# Patient Record
Sex: Male | Born: 1965 | Race: White | Hispanic: No | Marital: Married | Smoking: Never smoker
Health system: Southern US, Community
[De-identification: ages and names within clinical notes are randomized; demographics above are authoritative.]

## PROBLEM LIST (undated history)

## (undated) DIAGNOSIS — J383 Other diseases of vocal cords: Secondary | ICD-10-CM

## (undated) DIAGNOSIS — M109 Gout, unspecified: Secondary | ICD-10-CM

## (undated) DIAGNOSIS — K219 Gastro-esophageal reflux disease without esophagitis: Secondary | ICD-10-CM

## (undated) DIAGNOSIS — R112 Nausea with vomiting, unspecified: Secondary | ICD-10-CM

## (undated) DIAGNOSIS — N200 Calculus of kidney: Secondary | ICD-10-CM

## (undated) DIAGNOSIS — J189 Pneumonia, unspecified organism: Secondary | ICD-10-CM

## (undated) DIAGNOSIS — Z9889 Other specified postprocedural states: Secondary | ICD-10-CM

## (undated) DIAGNOSIS — E669 Obesity, unspecified: Secondary | ICD-10-CM

## (undated) DIAGNOSIS — G4730 Sleep apnea, unspecified: Secondary | ICD-10-CM

## (undated) DIAGNOSIS — Z87442 Personal history of urinary calculi: Secondary | ICD-10-CM

## (undated) DIAGNOSIS — M1990 Unspecified osteoarthritis, unspecified site: Secondary | ICD-10-CM

## (undated) HISTORY — PX: HERNIA REPAIR: SHX51

## (undated) HISTORY — PX: COLONOSCOPY: SHX174

## (undated) HISTORY — PX: COLONOSCOPY WITH ESOPHAGOGASTRODUODENOSCOPY (EGD): SHX5779

## (undated) HISTORY — PX: OTHER SURGICAL HISTORY: SHX169

---

## 2006-11-11 ENCOUNTER — Ambulatory Visit: Payer: Self-pay | Admitting: Family Medicine

## 2007-11-19 ENCOUNTER — Ambulatory Visit: Payer: Self-pay | Admitting: Gastroenterology

## 2012-12-17 ENCOUNTER — Ambulatory Visit: Payer: Self-pay | Admitting: Gastroenterology

## 2013-08-04 ENCOUNTER — Emergency Department: Payer: Self-pay

## 2015-03-03 ENCOUNTER — Ambulatory Visit
Admission: EM | Admit: 2015-03-03 | Discharge: 2015-03-03 | Disposition: A | Discharge status: Home / Self Care | Payer: 59 | Attending: Family Medicine | Admitting: Family Medicine

## 2015-03-03 DIAGNOSIS — J101 Influenza due to other identified influenza virus with other respiratory manifestations: Secondary | ICD-10-CM

## 2015-03-03 LAB — RAPID STREP SCREEN (MED CTR MEBANE ONLY): Streptococcus, Group A Screen (Direct): NEGATIVE

## 2015-03-03 LAB — RAPID INFLUENZA A&B ANTIGENS
Influenza A (ARMC): DETECTED
Influenza B (ARMC): NOT DETECTED

## 2015-03-03 MED ORDER — OSELTAMIVIR PHOSPHATE 75 MG PO CAPS: 75.0000 mg | ORAL_CAPSULE | Freq: Two times a day (BID) | ORAL | Status: DC

## 2015-03-03 NOTE — ED Notes (Signed)
Patient c/o fever, body aches, cough, sore throat, and chest congestion which started yesterday.  Pt.'s wife states that there are people in their household with the flu.

## 2015-03-03 NOTE — ED Provider Notes (Signed)
CSN: [id number]     Arrival date & time 03/03/15  1335 History   First MD Initiated Contact with Patient 03/03/15 1506     Chief Complaint  Patient presents with  . Cough  . Sore Throat   (Consider location/radiation/quality/duration/timing/severity/associated sxs/prior Treatment) Patient is a 50 y.o. male presenting with URI. The history is provided by the patient.  URI Presenting symptoms: congestion, cough, fatigue, fever and sore throat   Severity:  Moderate Onset quality:  Sudden Duration:  2 days Timing:  Constant Progression:  Worsening Chronicity:  New Relieved by:  Nothing Worsened by:  Nothing tried Ineffective treatments:  None tried Associated symptoms: headaches and myalgias   Associated symptoms: no arthralgias, no neck pain, no sinus pain, no sneezing and no wheezing   Risk factors: sick contacts   Risk factors: not elderly, no chronic cardiac disease, no chronic kidney disease, no chronic respiratory disease, no diabetes mellitus, no immunosuppression, no recent illness and no recent travel     History reviewed. No pertinent past medical history. Past Surgical History  Procedure Laterality Date  . Hernia repair    . Lithrotripsy for kidney stone removal     History reviewed. No pertinent family history. Social History  Substance Use Topics  . Smoking status: Never Smoker   . Smokeless tobacco: Current User    Types: Chew  . Alcohol Use: No    Review of Systems  Constitutional: Positive for fever and fatigue.  HENT: Positive for congestion and sore throat. Negative for sneezing.   Respiratory: Positive for cough. Negative for wheezing.   Musculoskeletal: Positive for myalgias. Negative for arthralgias and neck pain.  Neurological: Positive for headaches.    Allergies  Oxycodone  Home Medications   Prior to Admission medications   Medication Sig Start Date End Date Taking? Authorizing Provider  GARLIC PO Take by mouth.   Yes Historical Provider,  MD  oseltamivir (TAMIFLU) 75 MG capsule Take 1 capsule (75 mg total) by mouth 2 (two) times daily. 03/03/15   Payton Mccallum, MD  oseltamivir (TAMIFLU) 75 MG capsule Take 1 capsule (75 mg total) by mouth 2 (two) times daily. 03/03/15   Payton Mccallum, MD   Meds Ordered and Administered this Visit  Medications - No data to display  BP 118/79 mmHg  Pulse 70  Temp(Src) 99.2 F (37.3 C) (Oral)  Resp 16  Ht  (1.727 m)  Wt 210 lb (95.255 kg)  BMI 31.94 kg/m2  SpO2 99% No data found.   Physical Exam  Constitutional: He appears well-developed and well-nourished. No distress.  HENT:  Head: Normocephalic and atraumatic.  Right Ear: Tympanic membrane, external ear and ear canal normal.  Left Ear: Tympanic membrane, external ear and ear canal normal.  Nose: Rhinorrhea present.  Mouth/Throat: Uvula is midline, oropharynx is clear and moist and mucous membranes are normal. No oropharyngeal exudate or tonsillar abscesses.  Eyes: Conjunctivae and EOM are normal. Pupils are equal, round, and reactive to light. Right eye exhibits no discharge. Left eye exhibits no discharge. No scleral icterus.  Neck: Normal range of motion. Neck supple. No tracheal deviation present. No thyromegaly present.  Cardiovascular: Normal rate, regular rhythm and normal heart sounds.   Pulmonary/Chest: Effort normal and breath sounds normal. No stridor. No respiratory distress. He has no wheezes. He has no rales. He exhibits no tenderness.  Lymphadenopathy:    He has no cervical adenopathy.  Neurological: He is alert.  Skin: Skin is warm and dry. No rash noted.  He is not diaphoretic.  Nursing note and vitals reviewed.   ED Course  Procedures (including critical care time)  Labs Review Labs Reviewed  RAPID INFLUENZA A&B ANTIGENS ([facility] ONLY)  RAPID STREP SCREEN (NOT AT [facility])  CULTURE, GROUP A STREP [facility])    Imaging Review No results found.   Visual Acuity Review  Right Eye Distance:   Left Eye  Distance:   Bilateral Distance:    Right Eye Near:   Left Eye Near:    Bilateral Near:         MDM   1. Influenza A    New Prescriptions   OSELTAMIVIR (TAMIFLU) 75 MG CAPSULE    Take 1 capsule (75 mg total) by mouth 2 (two) times daily.   OSELTAMIVIR (TAMIFLU) 75 MG CAPSULE    Take 1 capsule (75 mg total) by mouth 2 (two) times daily.   1. Lab results and diagnosis reviewed with patient 2. rx as per orders above; reviewed possible side effects, interactions, risks and benefits  3. Recommend supportive treatment with otc analgesics, increased fluids, rest 4. Follow-up prn if symptoms worsen or don't improve    Payton Mccallum, MD 03/03/15 1526

## 2015-03-05 LAB — CULTURE, GROUP A STREP (THRC)

## 2016-05-31 ENCOUNTER — Ambulatory Visit (INDEPENDENT_AMBULATORY_CARE_PROVIDER_SITE_OTHER): Payer: 59

## 2016-05-31 ENCOUNTER — Ambulatory Visit: Payer: 59

## 2016-05-31 ENCOUNTER — Ambulatory Visit
Admission: EM | Admit: 2016-05-31 | Discharge: 2016-05-31 | Disposition: A | Discharge status: Home / Self Care | Payer: 59 | Attending: Family Medicine | Admitting: Family Medicine

## 2016-05-31 DIAGNOSIS — S61247A Puncture wound with foreign body of left little finger without damage to nail, initial encounter: Secondary | ICD-10-CM | POA: Diagnosis not present

## 2016-05-31 DIAGNOSIS — S6991XA Unspecified injury of right wrist, hand and finger(s), initial encounter: Secondary | ICD-10-CM

## 2016-05-31 MED ORDER — CEPHALEXIN 500 MG PO CAPS: 500.0000 mg | ORAL_CAPSULE | Freq: Four times a day (QID) | ORAL | 0 refills | Status: DC

## 2016-05-31 MED ORDER — TETANUS-DIPHTH-ACELL PERTUSSIS 5-2.5-18.5 LF-MCG/0.5 IM SUSP
0.5000 mL | Freq: Once | INTRAMUSCULAR | Status: AC
  Administered 2016-05-31: 0.5 mL via INTRAMUSCULAR

## 2016-05-31 MED ORDER — LIDOCAINE HCL (PF) 1 % IJ SOLN: 5.0000 mL | Freq: Once | INTRAMUSCULAR | Status: DC

## 2016-05-31 NOTE — Discharge Instructions (Signed)
Take medication as prescribed. Keep area clean. Apply topical antibiotic ointment.   Follow up with your primary care physician this week as needed. Return to Urgent care for new or worsening concerns.

## 2016-05-31 NOTE — ED Triage Notes (Signed)
Patient has a fish hook stuck in his right hand pinky finger. Patient states that the hook popped out of the fish and into his hand around 12pm. Patient states that he was able to cut the barb off but could not remove this last piece.

## 2016-05-31 NOTE — ED Provider Notes (Signed)
[facility] ____________________________________________  Time seen: Approximately 2:38 PM  I have reviewed the triage vital signs and the nursing notes.   HISTORY  Chief Complaint Foreign Body in Skin   HPI Christopher Castaneda is a 51 y.o. male presenting with spouse at bedside for evaluation of fish hook present and right fifth digit. Patient reports they were fishing just prior to arrival. Patient states that the fish jumped upwards causing the foot to then large in his right fifth digit. Patient states that he cut part of the hook off and tried to remove the hook himself but was unable to. Unsure of last tetanus immunization. States minimal pain at foreign body site. Denies other pain or injury. Denies head injury or loss consciousness. Reports right-hand dominant. Denies paresthesias, decreased range of motion or other injury.  Denies chest pain, shortness of breath, or rash. Denies recent sickness. Denies recent antibiotic use.    History reviewed. No pertinent past medical history.  There are no active problems to display for this patient.   Past Surgical History:  Procedure Laterality Date  . HERNIA REPAIR    . Lithrotripsy for kidney stone removal        Current Facility-Administered Medications:  .  lidocaine (PF) (XYLOCAINE) 1 % injection 5 mL, 5 mL, Other, Once, Renford Dills, NP  Current Outpatient Prescriptions:  .  cephALEXin (KEFLEX) 500 MG capsule, Take 1 capsule (500 mg total) by mouth 4 (four) times daily., Disp: 20 capsule, Rfl: 0 .  GARLIC PO, Take by mouth., Disp: , Rfl:  .  oseltamivir (TAMIFLU) 75 MG capsule, Take 1 capsule (75 mg total) by mouth 2 (two) times daily., Disp: 10 capsule, Rfl: 0 .  oseltamivir (TAMIFLU) 75 MG capsule, Take 1 capsule (75 mg total) by mouth 2 (two) times daily., Disp: 10 capsule, Rfl: 0  Allergies Oxycodone  History reviewed. No pertinent family history.  Social History Social History  Substance Use  Topics  . Smoking status: Never Smoker  . Smokeless tobacco: Current User    Types: Chew  . Alcohol use No    Review of Systems Constitutional: No fever/chills Cardiovascular: Denies chest pain. Respiratory: Denies shortness of breath. Gastrointestinal: No abdominal pain.   Musculoskeletal: Negative for back pain. Skin: As above.   ____________________________________________   PHYSICAL EXAM:  VITAL SIGNS: ED Triage Vitals  Enc Vitals Group     BP 05/31/16 1351 130/87     Pulse Rate 05/31/16 1351 72     Resp 05/31/16 1351 18     Temp 05/31/16 1351 97.1 F (36.2 C)     Temp Source 05/31/16 1351 Oral     SpO2 05/31/16 1351 99 %     Weight 05/31/16 1348 200 lb (90.7 kg)     Height 05/31/16 1348 5\' 7"  (1.702 m)     Head Circumference --      Peak Flow --      Pain Score 05/31/16 1348 6     Pain Loc --      Pain Edu? --      Excl. in GC? --     Constitutional: Alert and oriented. Well appearing and in no acute distress. Cardiovascular: Normal rate, regular rhythm. Grossly normal heart sounds.  Good peripheral circulation. Respiratory: Normal respiratory effort without tachypnea nor retractions. Breath sounds are clear and equal bilaterally. No wheezes, rales, rhonchi. Musculoskeletal: Ambulatory with steady gait. Neurologic:  Normal speech and language. Speech is normal. No gait instability.  Skin:  Skin  is warm, dry. Except: Right fifth digit fishhook foreign body present on the palmar surface of the middle phalanx, minimal tenderness to direct palpation, no active bleeding, no bony tenderness, normal distal sensation and capillary refill. Post hook removal, normal distal sensation and full range of motion present, no motor or tendon deficits noted. Psychiatric: Mood and affect are normal. Speech and behavior are normal. Patient exhibits appropriate insight and judgment   ___________________________________________   LABS (all labs ordered are listed, but only abnormal  results are displayed)  Labs Reviewed - No data to display ____________________________________________  RADIOLOGY  Dg Finger Little Right  Result Date: 05/31/2016 CLINICAL DATA:  Fishhook in fifth digit, initial encounter EXAM: RIGHT LITTLE FINGER 2+V COMPARISON:  None. FINDINGS: No acute fracture or dislocation is noted. A curved metallic foreign body is noted in the the ventral aspect of the fifth digit adjacent to the fifth middle phalanx. No bony trauma is noted. IMPRESSION: Radiopaque foreign body consistent with the given clinical history in the mid fifth digit. No bony trauma is noted. Electronically Signed   By: Alcide Clever M.D.   On: 05/31/2016 14:24   ____________________________________________   PROCEDURES Procedures   Procedure(s) performed:  Procedure explained and verbal consent obtained. Consent: Verbal consent obtained. Written consent not obtained. Risks and benefits: risks, benefits and alternatives were discussed Patient identity confirmed: verbally with patient and hospital-assigned identification number  Consent given by: patient   Fifth digit foreign body removal. Location: Fifth digit Tendon involvement: none Nerve involvement: none Preparation: Patient was prepped and draped in the usual sterile fashion. Anesthesia with 1% Lidocaine 3 mls Irrigation solution: saline and Betadine  Irrigation method: jet lavage Amount of cleaning: copious Sterile #11 blade scalpel utilized and lessen 0.5 cm incision made proximally to the fishhook where Barb would be present. Sterile forceps then utilized to remove fishhook. No repair indicated post removal. Patient tolerate well. Wound well approximated post repair.  Antibiotic ointment and dressing applied.  Wound care instructions provided.  Observe for any signs of infection or other problems.     ____________________________________________   INITIAL IMPRESSION / ASSESSMENT AND PLAN / ED COURSE  Pertinent labs  & imaging results that were available during my care of the patient were reviewed by me and considered in my medical decision making (see chart for details).  Well-appearing patient. No acute distress. Right fifth digit with fishhook present, per radiologist no bony abnormality on x-ray. Fishhook removed and wound copiously irrigated afterwards. Patient tolerated well. Discussed the patient and spouse evaluation of x-ray post foreign body removal to fully evaluate for removed contents, patient and spouse state that they examined the heart and no concern for retained foreign bodies. Declined repeat x-ray. 1. Restart patient on oral cephalexin. Tetanus immunization updated. Discussed wound care and warm water soaks. Encourage rest, fluids and supportive care.Discussed indication, risks and benefits of medications with patient.  Discussed follow up with Primary care physician this week. Discussed follow up and return parameters including no resolution or any worsening concerns. Patient verbalized understanding and agreed to plan.   ____________________________________________   FINAL CLINICAL IMPRESSION(S) / ED DIAGNOSES  Final diagnoses:  Fish hook injury of finger of right hand, initial encounter     Discharge Medication List as of 05/31/2016  3:04 PM    START taking these medications   Details  cephALEXin (KEFLEX) 500 MG capsule Take 1 capsule (500 mg total) by mouth 4 (four) times daily., Starting Sat 05/31/2016, Normal  Note: This dictation was prepared with Dragon dictation along with smaller phrase technology. Any transcriptional errors that result from this process are unintentional.         Renford DillsMiller, Boneta Standre, NP 05/31/16 1719

## 2016-09-30 ENCOUNTER — Other Ambulatory Visit: Payer: Self-pay | Admitting: Orthopedic Surgery

## 2016-09-30 DIAGNOSIS — M1711 Unilateral primary osteoarthritis, right knee: Secondary | ICD-10-CM

## 2016-09-30 DIAGNOSIS — M2391 Unspecified internal derangement of right knee: Secondary | ICD-10-CM

## 2016-09-30 DIAGNOSIS — M25461 Effusion, right knee: Secondary | ICD-10-CM

## 2016-11-11 ENCOUNTER — Ambulatory Visit
Admission: RE | Admit: 2016-11-11 | Discharge: 2016-11-11 | Disposition: A | Discharge status: Home / Self Care | Payer: 59 | Source: Ambulatory Visit | Attending: Orthopedic Surgery | Admitting: Orthopedic Surgery

## 2016-11-11 DIAGNOSIS — M67861 Other specified disorders of synovium, right knee: Secondary | ICD-10-CM | POA: Diagnosis not present

## 2016-11-11 DIAGNOSIS — M2391 Unspecified internal derangement of right knee: Secondary | ICD-10-CM

## 2016-11-11 DIAGNOSIS — M23221 Derangement of posterior horn of medial meniscus due to old tear or injury, right knee: Secondary | ICD-10-CM | POA: Diagnosis not present

## 2016-11-11 DIAGNOSIS — M1711 Unilateral primary osteoarthritis, right knee: Secondary | ICD-10-CM | POA: Diagnosis not present

## 2016-11-11 DIAGNOSIS — M25461 Effusion, right knee: Secondary | ICD-10-CM | POA: Diagnosis present

## 2016-11-26 ENCOUNTER — Encounter: Payer: Self-pay | Admitting: *Deleted

## 2016-11-26 ENCOUNTER — Other Ambulatory Visit: Payer: Self-pay

## 2016-12-02 ENCOUNTER — Encounter
Admission: RE | Disposition: A | Discharge status: Home / Self Care | Payer: Self-pay | Source: Ambulatory Visit | Attending: Orthopedic Surgery

## 2016-12-02 ENCOUNTER — Ambulatory Visit: Payer: 59 | Admitting: Anesthesiology

## 2016-12-02 ENCOUNTER — Ambulatory Visit
Admission: RE | Admit: 2016-12-02 | Discharge: 2016-12-02 | Disposition: A | Discharge status: Home / Self Care | Payer: 59 | Source: Ambulatory Visit | Attending: Orthopedic Surgery | Admitting: Orthopedic Surgery

## 2016-12-02 DIAGNOSIS — M1711 Unilateral primary osteoarthritis, right knee: Secondary | ICD-10-CM | POA: Insufficient documentation

## 2016-12-02 DIAGNOSIS — Z79899 Other long term (current) drug therapy: Secondary | ICD-10-CM | POA: Diagnosis not present

## 2016-12-02 DIAGNOSIS — M109 Gout, unspecified: Secondary | ICD-10-CM | POA: Insufficient documentation

## 2016-12-02 DIAGNOSIS — M23203 Derangement of unspecified medial meniscus due to old tear or injury, right knee: Secondary | ICD-10-CM | POA: Insufficient documentation

## 2016-12-02 DIAGNOSIS — G4730 Sleep apnea, unspecified: Secondary | ICD-10-CM | POA: Diagnosis not present

## 2016-12-02 DIAGNOSIS — M6751 Plica syndrome, right knee: Secondary | ICD-10-CM | POA: Insufficient documentation

## 2016-12-02 DIAGNOSIS — F17290 Nicotine dependence, other tobacco product, uncomplicated: Secondary | ICD-10-CM | POA: Insufficient documentation

## 2016-12-02 DIAGNOSIS — K219 Gastro-esophageal reflux disease without esophagitis: Secondary | ICD-10-CM | POA: Diagnosis not present

## 2016-12-02 HISTORY — DX: Gout, unspecified: M10.9

## 2016-12-02 HISTORY — DX: Gastro-esophageal reflux disease without esophagitis: K21.9

## 2016-12-02 HISTORY — PX: KNEE ARTHROSCOPY WITH MEDIAL MENISECTOMY: SHX5651

## 2016-12-02 HISTORY — DX: Sleep apnea, unspecified: G47.30

## 2016-12-02 HISTORY — DX: Other specified postprocedural states: Z98.890

## 2016-12-02 HISTORY — PX: CHONDROPLASTY: SHX5177

## 2016-12-02 HISTORY — DX: Calculus of kidney: N20.0

## 2016-12-02 HISTORY — PX: SYNOVECTOMY: SHX5180

## 2016-12-02 HISTORY — DX: Other specified postprocedural states: R11.2

## 2016-12-02 SURGERY — ARTHROSCOPY, KNEE, WITH MEDIAL MENISCECTOMY
Anesthesia: Regional | Laterality: Right | Wound class: Clean

## 2016-12-02 MED ORDER — PROPOFOL 10 MG/ML IV BOLUS
INTRAVENOUS | Status: DC | PRN
  Administered 2016-12-02: 200 mg via INTRAVENOUS

## 2016-12-02 MED ORDER — KETOROLAC TROMETHAMINE 30 MG/ML IJ SOLN
INTRAMUSCULAR | Status: DC | PRN
  Administered 2016-12-02: 30 mg via INTRAVENOUS

## 2016-12-02 MED ORDER — IBUPROFEN 800 MG PO TABS: 800.0000 mg | ORAL_TABLET | Freq: Three times a day (TID) | ORAL | 0 refills | Status: AC

## 2016-12-02 MED ORDER — FENTANYL CITRATE (PF) 100 MCG/2ML IJ SOLN
25.0000 ug | INTRAMUSCULAR | Status: DC | PRN
  Administered 2016-12-02 (×2): 25 ug via INTRAVENOUS

## 2016-12-02 MED ORDER — FENTANYL CITRATE (PF) 100 MCG/2ML IJ SOLN
INTRAMUSCULAR | Status: DC | PRN
  Administered 2016-12-02: 100 ug via INTRAVENOUS

## 2016-12-02 MED ORDER — ACETAMINOPHEN 160 MG/5ML PO SOLN: 325.0000 mg | ORAL | Status: DC | PRN

## 2016-12-02 MED ORDER — ONDANSETRON 4 MG PO TBDP: 4.0000 mg | ORAL_TABLET | Freq: Three times a day (TID) | ORAL | 0 refills | Status: DC | PRN

## 2016-12-02 MED ORDER — DEXTROSE 5 % IV SOLN
2000.0000 mg | Freq: Once | INTRAVENOUS | Status: AC
  Administered 2016-12-02: 2000 mg via INTRAVENOUS

## 2016-12-02 MED ORDER — OXYCODONE HCL 5 MG PO TABS
5.0000 mg | ORAL_TABLET | Freq: Once | ORAL | Status: DC | PRN
Start: 2016-12-02 — End: 2016-12-02

## 2016-12-02 MED ORDER — ONDANSETRON HCL 4 MG/2ML IJ SOLN
INTRAMUSCULAR | Status: DC | PRN
  Administered 2016-12-02: 4 mg via INTRAVENOUS

## 2016-12-02 MED ORDER — BUPIVACAINE HCL 0.5 % IJ SOLN
INTRAMUSCULAR | Status: DC | PRN
  Administered 2016-12-02 (×2): 5 mL

## 2016-12-02 MED ORDER — GLYCOPYRROLATE 0.2 MG/ML IJ SOLN
INTRAMUSCULAR | Status: DC | PRN
  Administered 2016-12-02: .1 mg via INTRAVENOUS

## 2016-12-02 MED ORDER — MIDAZOLAM HCL 5 MG/5ML IJ SOLN
INTRAMUSCULAR | Status: DC | PRN
  Administered 2016-12-02: 2 mg via INTRAVENOUS

## 2016-12-02 MED ORDER — ASPIRIN EC 325 MG PO TBEC: 325.0000 mg | DELAYED_RELEASE_TABLET | Freq: Every day | ORAL | 0 refills | Status: AC

## 2016-12-02 MED ORDER — HYDROCODONE-ACETAMINOPHEN 5-325 MG PO TABS: 1.0000 | ORAL_TABLET | ORAL | 0 refills | Status: DC | PRN

## 2016-12-02 MED ORDER — PROMETHAZINE HCL 25 MG/ML IJ SOLN: 6.2500 mg | INTRAMUSCULAR | Status: DC | PRN

## 2016-12-02 MED ORDER — LIDOCAINE-EPINEPHRINE 1 %-1:100000 IJ SOLN
INTRAMUSCULAR | Status: DC | PRN
Start: 2016-12-02 — End: 2016-12-02
  Administered 2016-12-02 (×2): 5 mL

## 2016-12-02 MED ORDER — LIDOCAINE HCL (CARDIAC) 20 MG/ML IV SOLN
INTRAVENOUS | Status: DC | PRN
  Administered 2016-12-02: 50 mg via INTRATRACHEAL

## 2016-12-02 MED ORDER — EPHEDRINE SULFATE 50 MG/ML IJ SOLN
INTRAMUSCULAR | Status: DC | PRN
  Administered 2016-12-02 (×2): 5 mg via INTRAVENOUS

## 2016-12-02 MED ORDER — ACETAMINOPHEN 325 MG PO TABS
325.0000 mg | ORAL_TABLET | ORAL | Status: DC | PRN
  Administered 2016-12-02: 650 mg via ORAL

## 2016-12-02 MED ORDER — DEXAMETHASONE SODIUM PHOSPHATE 4 MG/ML IJ SOLN
INTRAMUSCULAR | Status: DC | PRN
  Administered 2016-12-02: 4 mg via INTRAVENOUS

## 2016-12-02 MED ORDER — ROPIVACAINE HCL 5 MG/ML IJ SOLN
INTRAMUSCULAR | Status: DC | PRN
  Administered 2016-12-02: 20 mL via PERINEURAL

## 2016-12-02 MED ORDER — TRAMADOL HCL 50 MG PO TABS
50.0000 mg | ORAL_TABLET | Freq: Once | ORAL | Status: AC | PRN
  Administered 2016-12-02: 50 mg via ORAL

## 2016-12-02 MED ORDER — LACTATED RINGERS IV SOLN
10.0000 mL/h | INTRAVENOUS | Status: DC
Start: 2016-12-02 — End: 2016-12-02
  Administered 2016-12-02: 10 mL/h via INTRAVENOUS

## 2016-12-02 SURGICAL SUPPLY — 61 items
ADAPTER IRRIG TUBE 2 SPIKE SOL (ADAPTER) ×3 IMPLANT
BLADE SURG 15 STRL LF DISP TIS (BLADE) ×1 IMPLANT
BLADE SURG 15 STRL SS (BLADE) ×2
BLADE SURG SZ11 CARB STEEL (BLADE) ×3 IMPLANT
BNDG COHESIVE 4X5 TAN STRL (GAUZE/BANDAGES/DRESSINGS) ×3 IMPLANT
BNDG ESMARK 6X12 TAN STRL LF (GAUZE/BANDAGES/DRESSINGS) ×3 IMPLANT
BRACE KNEE POST OP SHORT (BRACE) ×3 IMPLANT
BRUSH SCRUB EZ  4% CHG (MISCELLANEOUS) ×2
BRUSH SCRUB EZ 4% CHG (MISCELLANEOUS) ×1 IMPLANT
BUR RADIUS 3.5 (BURR) ×3 IMPLANT
BUR RADIUS 4.0X18.5 (BURR) ×3 IMPLANT
BUTTON SUTURE 12 NAB (SUTURE) ×2 IMPLANT
BUTTON SUTURE 12MM NAB (SUTURE) ×1
CHLORAPREP W/TINT 26ML (MISCELLANEOUS) ×3 IMPLANT
CLEANER CAUTERY TIP 5X5 PAD (MISCELLANEOUS) ×1 IMPLANT
CLOSURE WOUND 1/2 X4 (GAUZE/BANDAGES/DRESSINGS) ×1
COOLER POLAR GLACIER W/PUMP (MISCELLANEOUS) ×3 IMPLANT
CUFF TOURN SGL QUICK 30 (MISCELLANEOUS) ×2
CUFF TOURN SGL QUICK 34 (TOURNIQUET CUFF) ×2
CUFF TRNQT CYL 34X4X40X1 (TOURNIQUET CUFF) ×1 IMPLANT
CUFF TRNQT CYL LO 30X4X (MISCELLANEOUS) ×1 IMPLANT
DRAPE IMP U-DRAPE 54X76 (DRAPES) ×3 IMPLANT
DRILL FLIPCUTTER II W/DRILL 6 (INSTRUMENTS) ×1 IMPLANT
FLIPCUTTER II W/DRILL 6 (INSTRUMENTS) ×3
GAUZE SPONGE 4X4 12PLY STRL (GAUZE/BANDAGES/DRESSINGS) ×3 IMPLANT
GLOVE BIO SURGEON STRL SZ7.5 (GLOVE) ×3 IMPLANT
GLOVE BIOGEL PI IND STRL 8 (GLOVE) ×1 IMPLANT
GLOVE BIOGEL PI INDICATOR 8 (GLOVE) ×2
GOWN STRL REUS W/ TWL LRG LVL3 (GOWN DISPOSABLE) ×1 IMPLANT
GOWN STRL REUS W/TWL LRG LVL3 (GOWN DISPOSABLE) ×5 IMPLANT
IV LACTATED RINGER IRRG 3000ML (IV SOLUTION) ×8
IV LR IRRIG 3000ML ARTHROMATIC (IV SOLUTION) ×4 IMPLANT
KIT RM TURNOVER STRD PROC AR (KITS) ×3 IMPLANT
MAT BLUE FLOOR 46X72 FLO (MISCELLANEOUS) ×3 IMPLANT
NDL MAYO CATGUT SZ5 (NEEDLE) ×2
NDL SUT 5 .5 CRC TPR PNT MAYO (NEEDLE) ×1 IMPLANT
NEPTUNE MANIFOLD (MISCELLANEOUS) ×3 IMPLANT
PACK ARTHROSCOPY KNEE (MISCELLANEOUS) ×3 IMPLANT
PAD ABD DERMACEA PRESS 5X9 (GAUZE/BANDAGES/DRESSINGS) ×9 IMPLANT
PAD CLEANER CAUTERY TIP 5X5 (MISCELLANEOUS) ×2
PAD WRAPON POLAR KNEE (MISCELLANEOUS) ×1 IMPLANT
PADDING CAST BLEND 6X4 STRL (MISCELLANEOUS) ×1 IMPLANT
PADDING STRL CAST 6IN (MISCELLANEOUS) ×2
PENCIL ELECTRO HAND CTR (MISCELLANEOUS) ×3 IMPLANT
SET TUBE SUCT SHAVER OUTFL 24K (TUBING) ×3 IMPLANT
STRIP CLOSURE SKIN 1/2X4 (GAUZE/BANDAGES/DRESSINGS) ×2 IMPLANT
SUT ETHILON 4-0 (SUTURE) ×2
SUT ETHILON 4-0 FS2 18XMFL BLK (SUTURE) ×1
SUT FIBERSTICK 2-0 (SUTURE) ×3 IMPLANT
SUT MNCRL 4-0 (SUTURE) ×2
SUT MNCRL 4-0 27XMFL (SUTURE) ×1
SUT VIC AB 2-0 SH 27 (SUTURE) ×2
SUT VIC AB 2-0 SH 27XBRD (SUTURE) ×1 IMPLANT
SUTURE ETHLN 4-0 FS2 18XMF BLK (SUTURE) ×1 IMPLANT
SUTURE MNCRL 4-0 27XMF (SUTURE) ×1 IMPLANT
TOWEL OR 17X26 4PK STRL BLUE (TOWEL DISPOSABLE) ×6 IMPLANT
TRAP MUCOUS 40ML (MISCELLANEOUS) ×3 IMPLANT
TUBING ARTHRO INFLOW-ONLY STRL (TUBING) ×3 IMPLANT
WAND HAND CNTRL MULTIVAC 50 (MISCELLANEOUS) IMPLANT
WAND MEGAVAC 90 (MISCELLANEOUS) ×3 IMPLANT
WRAPON POLAR PAD KNEE (MISCELLANEOUS) ×3

## 2016-12-02 NOTE — Anesthesia Preprocedure Evaluation (Signed)
Anesthesia Evaluation  Patient identified by MRN, date of birth, ID band Patient awake    Reviewed: Allergy & Precautions, NPO status   History of Anesthesia Complications (+) PONV  Airway Mallampati: II  TM Distance: >3 FB     Dental no notable dental hx.    Pulmonary sleep apnea (no CPAP) ,    breath sounds clear to auscultation       Cardiovascular (-) anginanegative cardio ROS   Rhythm:Regular Rate:Normal     Neuro/Psych    GI/Hepatic GERD  ,  Endo/Other  BMI 31   Renal/GU      Musculoskeletal   Abdominal   Peds  Hematology   Anesthesia Other Findings   Reproductive/Obstetrics                             Anesthesia Physical Anesthesia Plan  ASA: II  Anesthesia Plan: General and Regional   Post-op Pain Management:  Regional for Post-op pain   Induction:   PONV Risk Score and Plan: Ondansetron, Midazolam and Dexamethasone  Airway Management Planned: LMA  Additional Equipment:   Intra-op Plan:   Post-operative Plan:   Informed Consent: I have reviewed the patients History and Physical, chart, labs and discussed the procedure including the risks, benefits and alternatives for the proposed anesthesia with the patient or authorized representative who has indicated his/her understanding and acceptance.   Dental advisory given  Plan Discussed with: CRNA  Anesthesia Plan Comments:         Anesthesia Quick Evaluation

## 2016-12-02 NOTE — Op Note (Signed)
Operative Note   SURGERY DATE: 12/02/2016  PRE-OP DIAGNOSIS:  1. Right medial meniscus tear 2. Pigmented vilonodular synovitis of right knee  POST-OP DIAGNOSIS: 1. Right medial meniscus tear 2. Right medial plica 3. Right knee gout  PROCEDURES:  1. Right knee arthroscopy, partial medial meniscectomy 2. Right knee major synovectomy of (lateral and medial compartments) 3. Right knee Excision of medial plica 4. Right knee Arthroscopic removal of loose bodies  SURGEON: Rosealee AlbeeSunny H. Frazier Balfour, MD  ANESTHESIA: Gen  ESTIMATED BLOOD LOSS:minimal  TOTAL IV FLUIDS: per anesthesia  INDICATION(S):  Christopher Castaneda is a 51 y.o. 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 More recently, he has had more severe medial sided knee pain that has been non-responsive to conservative measures. An MRI revealed a medial meniscus tear and lesions suspicious for pigmented villonodular synovitis. After discussion of risks, benefits, and alternatives to surgery, the patient elected to proceed with surgery.   OPERATIVE FINDINGS:   Examination under anesthesia:A careful examination under anesthesia was performed. Passive range of motion was: Hyperextension: 2. Extension: 0. Flexion: 120. Lachman: normal. Pivot Shift: normal. Posterior drawer: normal. Varus stability in full extension: normal. Varus stability in 30 degrees of flexion: normal. Valgus stability in full extension: normal. Valgus stability in 30 degrees of flexion: normal.  Intra-operative findings:A thorough arthroscopic examination of the knee was performed. The findings are: 1. Suprapatellar pouch: Significant crystalline particles 2. Undersurface of median ridge: Significant crystalline particles 3. Medial patellar facet: Significant crystalline particles 4. Lateral patellar facet: Significant crystalline particles 5. Trochlea: Significant crystalline particles 6. Lateral gutter/popliteus  tendon: loose body noted, significant crystalline collections about synovium 7. Hoffa's fat pad: Inflamed 8. Medial gutter/plica: Large medial plica 9. ACL: Normal 10. PCL: Normal 11. Medial meniscus: Complex tear with vertical and horizontal components of the posterior horn with vertical tear at ~60% of meniscus width and horizontal tear posterior to it. Significant crystalline particles on meniscus.  12. Medial compartment cartilage: Grade 1 degenerative changes, Significant crystalline particles on MFC and tibial plateau 13. Lateral meniscus: Significant crystalline particles on meniscus, some separation from capsule anteriorly, but stable to probing 14. Lateral compartment cartilage: Grade 1 degenerative changes, Significant crystalline particles on LFC and tibial plateau  OPERATIVE REPORT:   I identified Christopher Castaneda the pre-operative holding area. I marked theoperativeknee with my initials. I reviewed the risks and benefits of the proposed surgical intervention and the patient (and/or patient's guardian) wished to proceed. The patient was transferred to the operative suite and placed in the supine position with all bony prominences padded. Anesthesia was administered. Appropriate IV antibioticswere administered within 30 minutes before incision. The extremity was then prepped and draped in standard fashion. A time out was performed confirming the correct extremity, correct patient, and correct procedure.  Arthroscopy portals were marked. Local anesthetic was injected to the planned portal sites. The anterolateral portalwasestablished with an 11blade followed by asuperolateralportal to provide for outflow of the joint.   The arthroscope was placed in the anterolateral portal and theninto the suprapatellar pouch. A diagnostic knee scope was completed with the above findings. The medial meniscus tear was identified.  Next the medial portal was established under needle  localization. The meniscal tear was debrided using an arthroscopic biter and an oscillating shaver until the meniscus had stable borders. The hypertrophic and synovitic fat pat was debrided. The loose body ~3 x 3mm consisting of some cartilage and crystalline particles was removed. There was significant crystalline  deposition about the lateral compartment synovium. Samples of this were taken and sent to pathology for confirmation of suspected gout. The lateral synovium was debrided with a combination of pituitary rongeur, biter, and shaver. The medial compartment also had significant deposition and this synovium was similarly debrided. Next, using a combination of a biter and shaver, the medial plica was excised.  Arthroscopic fluid was removed from the joint.  The portals were closed with 4-0 Nylon suture. Sterile dressings included Xeroform, 4x4s, Sof-Rol, and Bias wrap. A Polarcare was placed.  The patient was then awakened and taken to the PACU hemodynamically stable without complication.   POSTOPERATIVE PLAN: The patient will be discharged home today once they meet PACU criteria. Aspirin 325 mg daily was prescribed for 2 weeks for DVT prophylaxis. Physical therapy will start on POD#3-4.Weight-bearing as tolerated. They will follow up in 2 weeks per protocol. Rheumatology consult was also placed and suspicion of gout was discussed.

## 2016-12-02 NOTE — Anesthesia Procedure Notes (Addendum)
Procedure Name: LMA Insertion Date/Time: 12/02/2016 1:15 PM Performed by: Andee PolesBush, Kristl Morioka, CRNA Pre-anesthesia Checklist: Patient identified, Emergency Drugs available, Suction available, Timeout performed and Patient being monitored Patient Re-evaluated:Patient Re-evaluated prior to induction Oxygen Delivery Method: Circle system utilized Preoxygenation: Pre-oxygenation with 100% oxygen Induction Type: IV induction LMA: LMA inserted LMA Size: 4.0 Number of attempts: 1 Placement Confirmation: positive ETCO2 and breath sounds checked- equal and bilateral Tube secured with: Tape

## 2016-12-02 NOTE — Transfer of Care (Signed)
Immediate Anesthesia Transfer of Care Note  Patient: Christopher Castaneda  Procedure(s) Performed: KNEE ARTHROSCOPY WITH PARTIAL MEDIAL MENISECTOMY (Right ) SYNOVECTOMY (Right ) CHONDROPLASTY (Right )  Patient Location: PACU  Anesthesia Type: General, Regional  Level of Consciousness: awake, alert  and patient cooperative  Airway and Oxygen Therapy: Patient Spontanous Breathing and Patient connected to supplemental oxygen  Post-op Assessment: Post-op Vital signs reviewed, Patient's Cardiovascular Status Stable, Respiratory Function Stable, Patent Airway and No signs of Nausea or vomiting  Post-op Vital Signs: Reviewed and stable  Complications: No apparent anesthesia complications

## 2016-12-02 NOTE — Anesthesia Procedure Notes (Signed)
Anesthesia Regional Block: Adductor canal block   Pre-Anesthetic Checklist: ,, timeout performed, Correct Patient, Correct Site, Correct Laterality, Correct Procedure, Correct Position, site marked, Risks and benefits discussed,  Surgical consent,  Pre-op evaluation,  At surgeon's request and post-op pain management  Laterality: Right  Prep: chloraprep       Needles:  Injection technique: Single-shot  Needle Type: Stimiplex     Needle Length: 10cm  Needle Gauge: 21     Additional Needles:   Procedures:,,,, ultrasound used (permanent image in chart),,,,  Narrative:  Start time: 12/02/2016 12:07 PM End time: 12/02/2016 12:13 PM Injection made incrementally with aspirations every 5 mL.  Events: blood aspirated,,,,,,,,,,  Performed by: Personally  Anesthesiologist: Jola BabinskiHarker, Blanche Scovell, MD

## 2016-12-02 NOTE — Anesthesia Postprocedure Evaluation (Signed)
Anesthesia Post Note  Patient: Roderic PalauJames A Duncanson  Procedure(s) Performed: KNEE ARTHROSCOPY WITH PARTIAL MEDIAL MENISECTOMY (Right ) SYNOVECTOMY (Right ) CHONDROPLASTY (Right )  Patient location during evaluation: PACU Anesthesia Type: Regional Level of consciousness: awake Pain management: pain level controlled Vital Signs Assessment: post-procedure vital signs reviewed and stable Respiratory status: respiratory function stable Cardiovascular status: stable Postop Assessment: no signs of nausea or vomiting Anesthetic complications: no    Jola BabinskiElsje Jairy Angulo

## 2016-12-02 NOTE — Discharge Instructions (Signed)
Arthroscopic Knee Surgery - Partial Meniscectomy  Post-Op Instructions  1. Bracing or crutches: Crutches will be provided at the time of discharge from the surgery center if you do not already have them.  2. Ice: You may be provided with a device [facility](Polar [employer]) that allows you to ice the affected area effectively. Otherwise you can ice manually.   3. Driving:  Plan on not driving for at least two weeks. Please note that you are advised NOT to drive while taking narcotic pain medications as you may be impaired and unsafe to drive.  4. Activity: Ankle pumps several times an hour while awake to prevent blood clots. Weight bearing: as tolerated. Use crutches for as needed (usually ~1 week or less) until pain allows you to ambulate without a limp. Bending and straightening the knee is unlimited. Elevate knee above heart level as much as possible for one week. Avoid standing more than 5 minutes (consecutively) for the first week.  Avoid long distance travel for 2 weeks.  5. Medications:  - You have been provided a prescription for narcotic pain medicine. After surgery, take 1-2 narcotic tablets every 4 hours if needed for severe pain.  - A prescription for anti-nausea medication will be provided in case the narcotic medicine causes nausea - take 1 tablet every 6 hours only if nauseated.  - Take ibuprofen 800 mg every 8 hours WITH food to reduce post-operative knee swelling. DO NOT STOP IBUPROFEN POST-OP UNTIL INSTRUCTED TO DO SO at first post-op office visit (10-14 days after surgery). However, please discontinue if you have any abdominal discomfort after taking this.  - Take enteric coated aspirin 325 mg once daily for 2 weeks to prevent blood clots.    6. Bandages: The physical therapist should change the bandages at the first post-op appointment. If needed, the dressing supplies have been provided to you.  7. Physical Therapy: 1-2 times per week for 6 weeks. Therapy typically starts on post  operative Day 3 or 4. You have been provided an order for physical therapy. The therapist will provide home exercises.  8. Work: May return to full work usually around 2 weeks after 1st post-operative visit. May do light duty/desk job in approximately 1-2 weeks when off of narcotics, pain is well-controlled, and swelling has decreased.  9. Post-Op Appointments: Your first post-op appointment will be with Dr. Allena KatzPatel in approximately 2 weeks time.   If you find that they have not been scheduled please call the Orthopaedic Appointment front desk at [phone].      General Anesthesia, Adult, Care After These instructions provide you with information about caring for yourself after your procedure. Your health care provider may also give you more specific instructions. Your treatment has been planned according to current medical practices, but problems sometimes occur. Call your health care provider if you have any problems or questions after your procedure. What can I expect after the procedure? After the procedure, it is common to have:  Vomiting.  A sore throat.  Mental slowness.  It is common to feel:  Nauseous.  Cold or shivery.  Sleepy.  Tired.  Sore or achy, even in parts of your body where you did not have surgery.  Follow these instructions at home: For at least 24 hours after the procedure:  Do not: ? Participate in activities where you could fall or become injured. ? Drive. ? Use heavy machinery. ? Drink alcohol. ? Take sleeping pills or medicines that cause drowsiness. ? Make important decisions  or sign legal documents. ? Take care of children on your own.  Rest. Eating and drinking  If you vomit, drink water, juice, or soup when you can drink without vomiting.  Drink enough fluid to keep your urine clear or pale yellow.  Make sure you have little or no nausea before eating solid foods.  Follow the diet recommended by your health care  provider. General instructions  Have a responsible adult stay with you until you are awake and alert.  Return to your normal activities as told by your health care provider. Ask your health care provider what activities are safe for you.  Take over-the-counter and prescription medicines only as told by your health care provider.  If you smoke, do not smoke without supervision.  Keep all follow-up visits as told by your health care provider. This is important. Contact a health care provider if:  You continue to have nausea or vomiting at home, and medicines are not helpful.  You cannot drink fluids or start eating again.  You cannot urinate after 8-12 hours.  You develop a skin rash.  You have fever.  You have increasing redness at the site of your procedure. Get help right away if:  You have difficulty breathing.  You have chest pain.  You have unexpected bleeding.  You feel that you are having a life-threatening or urgent problem. This information is not intended to replace advice given to you by your health care provider. Make sure you discuss any questions you have with your health care provider. Document Released: 03/31/2000 Document Revised: 05/28/2015 Document Reviewed: 12/07/2014 Elsevier Interactive Patient Education  [employer]2018 [employer].

## 2016-12-02 NOTE — H&P (Signed)
Paper H&P to be scanned into permanent record. H&P reviewed. No significant changes noted.  

## 2016-12-02 NOTE — Progress Notes (Signed)
Assisted Dr. Harker with right, ultrasound guided, popliteal block. Side rails up, monitors on throughout procedure. See vital signs in flow sheet. Tolerated Procedure well.  

## 2016-12-03 ENCOUNTER — Encounter: Payer: Self-pay | Admitting: Orthopedic Surgery

## 2016-12-04 LAB — SURGICAL PATHOLOGY

## 2019-02-15 ENCOUNTER — Other Ambulatory Visit
Admission: RE | Admit: 2019-02-15 | Discharge: 2019-02-15 | Disposition: A | Discharge status: Home / Self Care | Payer: 59 | Source: Ambulatory Visit | Attending: Orthopedic Surgery | Admitting: Orthopedic Surgery

## 2019-02-15 DIAGNOSIS — Z9889 Other specified postprocedural states: Secondary | ICD-10-CM | POA: Insufficient documentation

## 2019-02-15 DIAGNOSIS — M25561 Pain in right knee: Secondary | ICD-10-CM | POA: Insufficient documentation

## 2019-02-15 DIAGNOSIS — E669 Obesity, unspecified: Secondary | ICD-10-CM | POA: Diagnosis present

## 2019-02-15 DIAGNOSIS — M1A0610 Idiopathic chronic gout, right knee, without tophus (tophi): Secondary | ICD-10-CM | POA: Diagnosis present

## 2019-02-15 DIAGNOSIS — M1711 Unilateral primary osteoarthritis, right knee: Secondary | ICD-10-CM | POA: Insufficient documentation

## 2019-02-15 LAB — SYNOVIAL CELL COUNT + DIFF, W/ CRYSTALS
Eosinophils-Synovial: 0 %
Lymphocytes-Synovial Fld: 2 %
Monocyte-Macrophage-Synovial Fluid: 5 %
Neutrophil, Synovial: 93 %
WBC, Synovial: 10242 /mm3 — ABNORMAL HIGH (ref 0–200)

## 2019-02-18 LAB — BODY FLUID CULTURE: Culture: NO GROWTH

## 2020-01-02 ENCOUNTER — Other Ambulatory Visit: Payer: Self-pay

## 2020-01-02 ENCOUNTER — Other Ambulatory Visit
Admission: RE | Admit: 2020-01-02 | Discharge: 2020-01-02 | Disposition: A | Discharge status: Home / Self Care | Payer: 59 | Source: Ambulatory Visit | Attending: Internal Medicine | Admitting: Internal Medicine

## 2020-01-02 DIAGNOSIS — Z01812 Encounter for preprocedural laboratory examination: Secondary | ICD-10-CM | POA: Diagnosis present

## 2020-01-02 DIAGNOSIS — Z20822 Contact with and (suspected) exposure to covid-19: Secondary | ICD-10-CM | POA: Diagnosis not present

## 2020-01-03 ENCOUNTER — Encounter: Payer: Self-pay | Admitting: Internal Medicine

## 2020-01-03 LAB — SARS CORONAVIRUS 2 (TAT 6-24 HRS): SARS Coronavirus 2: NEGATIVE

## 2020-01-04 ENCOUNTER — Ambulatory Visit
Admission: RE | Admit: 2020-01-04 | Discharge: 2020-01-04 | Disposition: A | Discharge status: Home / Self Care | Payer: 59 | Attending: Internal Medicine | Admitting: Internal Medicine

## 2020-01-04 ENCOUNTER — Ambulatory Visit: Payer: 59 | Admitting: Certified Registered Nurse Anesthetist

## 2020-01-04 ENCOUNTER — Encounter: Payer: Self-pay | Admitting: Internal Medicine

## 2020-01-04 ENCOUNTER — Encounter
Admission: RE | Disposition: A | Discharge status: Home / Self Care | Payer: Self-pay | Source: Home / Self Care | Attending: Internal Medicine

## 2020-01-04 ENCOUNTER — Other Ambulatory Visit: Payer: Self-pay

## 2020-01-04 DIAGNOSIS — Z800 Family history of malignant neoplasm of digestive organs: Secondary | ICD-10-CM | POA: Insufficient documentation

## 2020-01-04 DIAGNOSIS — K2100 Gastro-esophageal reflux disease with esophagitis, without bleeding: Secondary | ICD-10-CM | POA: Diagnosis not present

## 2020-01-04 DIAGNOSIS — K2970 Gastritis, unspecified, without bleeding: Secondary | ICD-10-CM | POA: Diagnosis not present

## 2020-01-04 DIAGNOSIS — R1013 Epigastric pain: Secondary | ICD-10-CM | POA: Insufficient documentation

## 2020-01-04 DIAGNOSIS — Z79899 Other long term (current) drug therapy: Secondary | ICD-10-CM | POA: Insufficient documentation

## 2020-01-04 DIAGNOSIS — K5730 Diverticulosis of large intestine without perforation or abscess without bleeding: Secondary | ICD-10-CM | POA: Insufficient documentation

## 2020-01-04 DIAGNOSIS — K317 Polyp of stomach and duodenum: Secondary | ICD-10-CM | POA: Diagnosis not present

## 2020-01-04 DIAGNOSIS — K319 Disease of stomach and duodenum, unspecified: Secondary | ICD-10-CM | POA: Insufficient documentation

## 2020-01-04 DIAGNOSIS — K640 First degree hemorrhoids: Secondary | ICD-10-CM | POA: Insufficient documentation

## 2020-01-04 DIAGNOSIS — Z1211 Encounter for screening for malignant neoplasm of colon: Secondary | ICD-10-CM | POA: Insufficient documentation

## 2020-01-04 HISTORY — DX: Obesity, unspecified: E66.9

## 2020-01-04 HISTORY — DX: Other diseases of vocal cords: J38.3

## 2020-01-04 HISTORY — PX: COLONOSCOPY: SHX5424

## 2020-01-04 HISTORY — PX: ESOPHAGOGASTRODUODENOSCOPY: SHX5428

## 2020-01-04 SURGERY — EGD (ESOPHAGOGASTRODUODENOSCOPY)
Anesthesia: General

## 2020-01-04 MED ORDER — PROPOFOL 10 MG/ML IV BOLUS
INTRAVENOUS | Status: DC | PRN
  Administered 2020-01-04: 80 mg via INTRAVENOUS

## 2020-01-04 MED ORDER — SODIUM CHLORIDE 0.9 % IV SOLN: INTRAVENOUS | Status: DC

## 2020-01-04 MED ORDER — LIDOCAINE HCL (CARDIAC) PF 100 MG/5ML IV SOSY
PREFILLED_SYRINGE | INTRAVENOUS | Status: DC | PRN
  Administered 2020-01-04: 100 mg via INTRAVENOUS

## 2020-01-04 MED ORDER — PROPOFOL 500 MG/50ML IV EMUL
INTRAVENOUS | Status: DC | PRN
  Administered 2020-01-04: 120 ug/kg/min via INTRAVENOUS

## 2020-01-04 NOTE — Op Note (Signed)
[facility] Patient Name: Christopher Castaneda Procedure Date: 01/04/2020 10:48 AM MRN: [id number] Account #: [id number] Date of Birth: 04/11/1965 Admit Type: Outpatient Age: 54 Room: [facility] ENDO ROOM 2 Gender: Male Note Status: Finalized Procedure:             Colonoscopy Indications:           Screening in patient at increased risk: Family history                         of 1st-degree relative with colorectal cancer Providers:             Boykin Nearing. Norma Fredrickson MD, MD Referring MD:          Marina Goodell (Referring MD) Medicines:             Propofol per Anesthesia Complications:         No immediate complications. Procedure:             Pre-Anesthesia Assessment:                        - The risks and benefits of the procedure and the                         sedation options and risks were discussed with the                         patient. All questions were answered and informed                         consent was obtained.                        - Patient identification and proposed procedure were                         verified prior to the procedure by the nurse. The                         procedure was verified in the procedure room.                        - ASA Grade Assessment: III - A patient with severe                         systemic disease.                        - After reviewing the risks and benefits, the patient                         was deemed in satisfactory condition to undergo the                         procedure.                        After obtaining informed consent, the colonoscope was                         passed under direct  vision. Throughout the procedure,                         the patient's blood pressure, pulse, and oxygen                         saturations were monitored continuously. The                         Colonoscope was introduced through the anus and                         advanced to the the  cecum, identified by appendiceal                         orifice and ileocecal valve. The colonoscopy was                         performed without difficulty. The patient tolerated                         the procedure well. The quality of the bowel                         preparation was adequate. The ileocecal valve,                         appendiceal orifice, and rectum were photographed. Findings:      The perianal and digital rectal examinations were normal. Pertinent       negatives include normal sphincter tone and no palpable rectal lesions.      Non-bleeding internal hemorrhoids were found during retroflexion. The       hemorrhoids were Grade I (internal hemorrhoids that do not prolapse).      A few small-mouthed diverticula were found in the left colon.      The exam was otherwise without abnormality. Impression:            - Non-bleeding internal hemorrhoids.                        - Diverticulosis in the left colon.                        - The examination was otherwise normal.                        - No specimens collected. Recommendation:        - Patient has a contact number available for                         emergencies. The signs and symptoms of potential                         delayed complications were discussed with the patient.                         Return to normal activities tomorrow. Written  discharge instructions were provided to the patient.                        - Await pathology results from EGD, also performed                         today.                        - Resume previous diet.                        - Continue present medications.                        - Repeat colonoscopy in 5 years for screening purposes.                        - Return to GI office PRN.                        - The findings and recommendations were discussed with                         the patient. Procedure Code(s):     --- Professional ---                         D1761, Colorectal cancer screening; colonoscopy on                         individual at high risk Diagnosis Code(s):     --- Professional ---                        K57.30, Diverticulosis of large intestine without                         perforation or abscess without bleeding                        K64.0, First degree hemorrhoids                        Z80.0, Family history of malignant neoplasm of                         digestive organs CPT copyright 2019 American Medical Association. All rights reserved. The codes documented in this report are preliminary and upon coder review may  be revised to meet current compliance requirements. Stanton Kidney MD, MD 01/04/2020 11:33:34 AM This report has been signed electronically. Number of Addenda: 0 Note Initiated On: 01/04/2020 10:48 AM Scope Withdrawal Time: 0 hours 6 minutes 49 seconds  Total Procedure Duration: 0 hours 8 minutes 42 seconds  Estimated Blood Loss:  Estimated blood loss: none.      [facility]

## 2020-01-04 NOTE — Interval H&P Note (Signed)
History and Physical Interval Note:  01/04/2020 10:46 AM  Christopher Castaneda  has presented today for surgery, with the diagnosis of Family history of Colon Cancer, GERD.  The various methods of treatment have been discussed with the patient and family. After consideration of risks, benefits and other options for treatment, the patient has consented to  Procedure(s): ESOPHAGOGASTRODUODENOSCOPY (EGD) (N/A) COLONOSCOPY (N/A) as a surgical intervention.  The patient's history has been reviewed, patient examined, no change in status, stable for surgery.  I have reviewed the patient's chart and labs.  Questions were answered to the patient's satisfaction.     [place], [place]

## 2020-01-04 NOTE — H&P (Signed)
Outpatient short stay form Pre-procedure 01/04/2020 10:44 AM Christopher Castaneda, M.D.  Primary Physician: Maudie Flakes, M.D.  Reason for visit:  Family history of colon cancer (father), GERD, hx esophagitis, epigastric pain.  History of present illness:  54year old patient presenting for family history of colon cancer. Patient denies any change in bowel habits, rectal bleeding or involuntary weight loss. Patient has intermittent epigastric pain and uncomplicated GERD. No dysphagia, hemetemesis, anorexia, melena or hemetemesis. Admits to some NSAID use.      Current Facility-Administered Medications:  .  0.9 %  sodium chloride infusion, , Intravenous, Continuous, [place], Boykin Nearing, MD, Last Rate: 20 mL/hr at 01/04/20 0921, New Bag at 01/04/20 9678  Medications Prior to Admission  Medication Sig Dispense Refill Last Dose  . calcium carbonate (TUMS - DOSED IN MG ELEMENTAL CALCIUM) 500 MG chewable tablet Chew 1 tablet by mouth as needed for indigestion or heartburn.   01/03/2020 at 1800  . colchicine 0.6 MG tablet Take 0.6 mg by mouth daily.   Past Week at Unknown time  . indomethacin (INDOCIN) 25 MG capsule Take 25 mg by mouth as needed.   Past Week at Unknown time  . valACYclovir (VALTREX) 500 MG tablet Take 500 mg by mouth 2 (two) times daily as needed.   Past Week at Unknown time  . HYDROcodone-acetaminophen (NORCO) 5-325 MG tablet Take 1-2 tablets by mouth every 4 (four) hours as needed for moderate pain or severe pain. 20 tablet 0   . ondansetron (ZOFRAN ODT) 4 MG disintegrating tablet Take 1 tablet (4 mg total) by mouth every 8 (eight) hours as needed for nausea or vomiting. 20 tablet 0      Allergies  Allergen Reactions  . Oxycodone Shortness Of Breath     Past Medical History:  Diagnosis Date  . GERD (gastroesophageal reflux disease)   . Gout   . Kidney stones   . Obesity   . PONV (postoperative nausea and vomiting)    after lithotripsy  . Sleep apnea    CPAP, has  not used for several yrs. Reports caused by reflux  . Vocal cord dysfunction     Review of systems:  Otherwise negative.    Physical Exam  Gen: Alert, oriented. Appears stated age.  HEENT: [place]/AT. PERRLA. Lungs: CTA, no wheezes. CV: RR nl S1, S2. Abd: soft, benign, no masses. BS+ Ext: No edema. Pulses 2+    Planned procedures: Proceed with EGD and colonoscopy. The patient understands the nature of the planned procedure, indications, risks, alternatives and potential complications including but not limited to bleeding, infection, perforation, damage to internal organs and possible oversedation/side effects from anesthesia. The patient agrees and gives consent to proceed.  Please refer to procedure notes for findings, recommendations and patient disposition/instructions.     Christopher Castaneda, M.D. Gastroenterology 01/04/2020  10:44 AM

## 2020-01-04 NOTE — Anesthesia Preprocedure Evaluation (Addendum)
Anesthesia Evaluation  Patient identified by MRN, date of birth, ID band Patient awake    Reviewed: Allergy & Precautions, H&P , NPO status , Patient's Chart, lab work & pertinent test results  History of Anesthesia Complications (+) PONV  Airway Mallampati: III  TM Distance: >3 FB     Dental  (+) Chipped   Pulmonary sleep apnea and Continuous Positive Airway Pressure Ventilation , neg COPD,    breath sounds clear to auscultation       Cardiovascular (-) angina(-) Past MI and (-) Cardiac Stents negative cardio ROS  (-) dysrhythmias  Rhythm:regular Rate:Normal     Neuro/Psych negative neurological ROS  negative psych ROS   GI/Hepatic Neg liver ROS, GERD  ,  Endo/Other  negative endocrine ROS  Renal/GU negative Renal ROS  negative genitourinary   Musculoskeletal   Abdominal   Peds  Hematology negative hematology ROS (+)   Anesthesia Other Findings Past Medical History: No date: GERD (gastroesophageal reflux disease) No date: Gout No date: Kidney stones No date: Obesity No date: PONV (postoperative nausea and vomiting)     Comment:  after lithotripsy No date: Sleep apnea     Comment:  CPAP, has not used for several yrs. Reports caused by               reflux No date: Vocal cord dysfunction  Past Surgical History: 12/02/2016: CHONDROPLASTY; Right     Comment:  Procedure: CHONDROPLASTY;  Surgeon: Signa Kell, MD;                Location: [facility];  Service: Orthopedics;                Laterality: Right; No date: COLONOSCOPY No date: COLONOSCOPY WITH ESOPHAGOGASTRODUODENOSCOPY (EGD) No date: HERNIA REPAIR 12/02/2016: KNEE ARTHROSCOPY WITH MEDIAL MENISECTOMY; Right     Comment:  Procedure: KNEE ARTHROSCOPY WITH PARTIAL MEDIAL               MENISECTOMY;  Surgeon: Signa Kell, MD;  Location:               [facility];  Service: Orthopedics;  Laterality:               Right; No date:  Lithrotripsy for kidney stone removal 12/02/2016: SYNOVECTOMY; Right     Comment:  Procedure: SYNOVECTOMY;  Surgeon: Signa Kell, MD;                Location: [facility];  Service: Orthopedics;                Laterality: Right;  BMI    Body Mass Index: 31.93 kg/m      Reproductive/Obstetrics negative OB ROS                           Anesthesia Physical Anesthesia Plan  ASA: II  Anesthesia Plan: General   Post-op Pain Management:    Induction:   PONV Risk Score and Plan: Propofol infusion and TIVA  Airway Management Planned: Simple Face Mask  Additional Equipment:   Intra-op Plan:   Post-operative Plan:   Informed Consent: I have reviewed the patients History and Physical, chart, labs and discussed the procedure including the risks, benefits and alternatives for the proposed anesthesia with the patient or authorized representative who has indicated his/her understanding and acceptance.     Dental Advisory Given  Plan Discussed with: Anesthesiologist, CRNA and Surgeon  Anesthesia Plan Comments:  Anesthesia Quick Evaluation  

## 2020-01-04 NOTE — Transfer of Care (Signed)
Immediate Anesthesia Transfer of Care Note  Patient: Christopher Castaneda  Procedure(s) Performed: ESOPHAGOGASTRODUODENOSCOPY (EGD) (N/A ) COLONOSCOPY (N/A )  Patient Location: PACU and Endoscopy Unit  Anesthesia Type:General  Level of Consciousness: drowsy  Airway & Oxygen Therapy: Patient Spontanous Breathing  Post-op Assessment: Report given to RN and Post -op Vital signs reviewed and stable  Post vital signs: Reviewed and stable  Last Vitals:  Vitals Value Taken Time  BP    Temp    Pulse 72 01/04/20 1133  Resp 7 01/04/20 1133  SpO2 95 % 01/04/20 1133  Vitals shown include unvalidated device data.  Last Pain:  Vitals:   01/04/20 0907  TempSrc: Temporal  PainSc: 0-No pain         Complications: No complications documented.

## 2020-01-04 NOTE — Op Note (Signed)
[facility] Gastroenterology Patient Name: Christopher Castaneda Procedure Date: 01/04/2020 10:49 AM MRN: [id number] Account #: [id number] Date of Birth: Sep 29, 1965 Admit Type: Outpatient Age: 54 Room: [facility] ENDO ROOM 2 Gender: Male Note Status: Finalized Procedure:             Upper GI endoscopy Indications:           Epigastric abdominal pain, Esophageal reflux Providers:             Boykin Nearing. Norma Fredrickson MD, MD Referring MD:          Marina Goodell (Referring MD) Medicines:             Propofol per Anesthesia Complications:         No immediate complications. Procedure:             Pre-Anesthesia Assessment:                        - The risks and benefits of the procedure and the                         sedation options and risks were discussed with the                         patient. All questions were answered and informed                         consent was obtained.                        - Patient identification and proposed procedure were                         verified prior to the procedure by the nurse. The                         procedure was verified in the procedure room.                        - ASA Grade Assessment: III - A patient with severe                         systemic disease.                        - After reviewing the risks and benefits, the patient                         was deemed in satisfactory condition to undergo the                         procedure.                        After obtaining informed consent, the endoscope was                         passed under direct vision. Throughout the procedure,                         the patient's blood  pressure, pulse, and oxygen                         saturations were monitored continuously. The Endoscope                         was introduced through the mouth, and advanced to the                         third part of duodenum. The upper GI endoscopy was                          accomplished without difficulty. The patient tolerated                         the procedure well. Findings:      The Z-line was irregular and was found at the gastroesophageal junction.      Patchy minimal inflammation characterized by erythema was found in the       gastric antrum. Biopsies were taken with a cold forceps for Helicobacter       pylori testing.      A single 7 mm semi-sessile polyp with no bleeding and no stigmata of       recent bleeding was found on the lesser curvature of the stomach.       Biopsies were taken with a cold forceps for histology.      The cardia and gastric fundus were normal on retroflexion.      Diffuse mildly erythematous mucosa without active bleeding and with no       stigmata of bleeding was found in the duodenal bulb.      The second portion of the duodenum, third portion of the duodenum and       fourth portion of the duodenum were normal.      The exam was otherwise without abnormality. Impression:            - Z-line irregular, at the gastroesophageal junction.                        - Gastritis. Biopsied.                        - A single gastric polyp. Biopsied.                        - Erythematous duodenopathy.                        - Normal second portion of the duodenum, third portion                         of the duodenum and fourth portion of the duodenum.                        - The examination was otherwise normal. Recommendation:        - Await pathology results.                        - Proceed with colonoscopy Procedure Code(s):     --- Professional ---  33295, Esophagogastroduodenoscopy, flexible,                         transoral; with biopsy, single or multiple Diagnosis Code(s):     --- Professional ---                        K21.9, Gastro-esophageal reflux disease without                         esophagitis                        R10.13, Epigastric pain                        K31.89, Other diseases  of stomach and duodenum                        K31.7, Polyp of stomach and duodenum                        K29.70, Gastritis, unspecified, without bleeding                        K22.8, Other specified diseases of esophagus CPT copyright 2019 American Medical Association. All rights reserved. The codes documented in this report are preliminary and upon coder review may  be revised to meet current compliance requirements. Stanton Kidney MD, MD 01/04/2020 11:16:44 AM This report has been signed electronically. Number of Addenda: 0 Note Initiated On: 01/04/2020 10:49 AM Estimated Blood Loss:  Estimated blood loss: none.      [facility]

## 2020-01-04 NOTE — Anesthesia Postprocedure Evaluation (Signed)
Anesthesia Post Note  Patient: Christopher Castaneda  Procedure(s) Performed: ESOPHAGOGASTRODUODENOSCOPY (EGD) (N/A ) COLONOSCOPY (N/A )  Patient location during evaluation: PACU Anesthesia Type: General Level of consciousness: awake and alert Pain management: pain level controlled Vital Signs Assessment: post-procedure vital signs reviewed and stable Respiratory status: spontaneous breathing, nonlabored ventilation and respiratory function stable Cardiovascular status: blood pressure returned to baseline and stable Postop Assessment: no apparent nausea or vomiting Anesthetic complications: no   No complications documented.   Last Vitals:  Vitals:   01/04/20 1153 01/04/20 1205  BP: 108/86 (!) 119/92  Pulse: 64 62  Resp: 14 16  Temp:    SpO2: 99% 100%    Last Pain:  Vitals:   01/04/20 1205  TempSrc:   PainSc: 0-No pain                 Aurelio Brash Elba Dendinger

## 2020-01-05 ENCOUNTER — Encounter: Payer: Self-pay | Admitting: Internal Medicine

## 2020-01-05 LAB — SURGICAL PATHOLOGY

## 2021-06-18 ENCOUNTER — Emergency Department
Admission: EM | Admit: 2021-06-18 | Discharge: 2021-06-18 | Disposition: A | Discharge status: Home / Self Care | Payer: 59 | Attending: Emergency Medicine | Admitting: Emergency Medicine

## 2021-06-18 ENCOUNTER — Emergency Department: Payer: 59

## 2021-06-18 ENCOUNTER — Other Ambulatory Visit: Payer: Self-pay

## 2021-06-18 DIAGNOSIS — R1031 Right lower quadrant pain: Secondary | ICD-10-CM | POA: Diagnosis present

## 2021-06-18 DIAGNOSIS — N200 Calculus of kidney: Secondary | ICD-10-CM | POA: Insufficient documentation

## 2021-06-18 LAB — URINALYSIS, ROUTINE W REFLEX MICROSCOPIC
Bacteria, UA: NONE SEEN
Bilirubin Urine: NEGATIVE
Glucose, UA: NEGATIVE mg/dL
Ketones, ur: NEGATIVE mg/dL
Leukocytes,Ua: NEGATIVE
Nitrite: NEGATIVE
Protein, ur: NEGATIVE mg/dL
RBC / HPF: >50 RBC/hpf — ABNORMAL HIGH (ref 0–5)
Specific Gravity, Urine: 1.017 (ref 1.005–1.030)
Squamous Epithelial / HPF: NONE SEEN (ref 0–5)
pH: 5 (ref 5.0–8.0)

## 2021-06-18 LAB — COMPREHENSIVE METABOLIC PANEL
ALT: 31 U/L (ref 0–44)
AST: 22 U/L (ref 15–41)
Albumin: 4.4 g/dL (ref 3.5–5.0)
Alkaline Phosphatase: 64 U/L (ref 38–126)
Anion gap: 7 (ref 5–15)
BUN: 14 mg/dL (ref 6–20)
CO2: 26 mmol/L (ref 22–32)
Calcium: 9.1 mg/dL (ref 8.9–10.3)
Chloride: 104 mmol/L (ref 98–111)
Creatinine, Ser: 1.13 mg/dL (ref 0.61–1.24)
GFR, Estimated: >60 mL/min (ref >60)
Glucose, Bld: 184 mg/dL — ABNORMAL HIGH (ref 70–99)
Potassium: 3.7 mmol/L (ref 3.5–5.1)
Sodium: 137 mmol/L (ref 135–145)
Total Bilirubin: 0.8 mg/dL (ref 0.3–1.2)
Total Protein: 7.6 g/dL (ref 6.5–8.1)

## 2021-06-18 LAB — CBC
HCT: 45.7 % (ref 39.0–52.0)
Hemoglobin: 14.9 g/dL (ref 13.0–17.0)
MCH: 28.7 pg (ref 26.0–34.0)
MCHC: 32.6 g/dL (ref 30.0–36.0)
MCV: 87.9 fL (ref 80.0–100.0)
Platelets: 199 10*3/uL (ref 150–400)
RBC: 5.2 MIL/uL (ref 4.22–5.81)
RDW: 12.6 % (ref 11.5–15.5)
WBC: 8.3 10*3/uL (ref 4.0–10.5)
nRBC: 0 % (ref 0.0–0.2)

## 2021-06-18 LAB — LIPASE, BLOOD: Lipase: 35 U/L (ref 11–51)

## 2021-06-18 MED ORDER — ONDANSETRON HCL 4 MG/2ML IJ SOLN
4.0000 mg | Freq: Once | INTRAMUSCULAR | Status: AC
  Administered 2021-06-18: 4 mg via INTRAVENOUS
  Filled 2021-06-18: qty 2

## 2021-06-18 MED ORDER — HYDROMORPHONE HCL 1 MG/ML IJ SOLN
1.0000 mg | Freq: Once | INTRAMUSCULAR | Status: AC
  Administered 2021-06-18: 1 mg via INTRAVENOUS
  Filled 2021-06-18: qty 1

## 2021-06-18 MED ORDER — HYDROCODONE-ACETAMINOPHEN 5-325 MG PO TABS
2.0000 | ORAL_TABLET | Freq: Once | ORAL | Status: AC
  Administered 2021-06-18: 2 via ORAL
  Filled 2021-06-18: qty 2

## 2021-06-18 MED ORDER — TAMSULOSIN HCL 0.4 MG PO CAPS: 0.4000 mg | ORAL_CAPSULE | Freq: Every day | ORAL | 0 refills | Status: DC

## 2021-06-18 MED ORDER — HYDROCODONE-ACETAMINOPHEN 5-325 MG PO TABS: 1.0000 | ORAL_TABLET | Freq: Four times a day (QID) | ORAL | 0 refills | Status: DC | PRN

## 2021-06-18 MED ORDER — SODIUM CHLORIDE 0.9 % IV BOLUS
1000.0000 mL | Freq: Once | INTRAVENOUS | Status: AC
  Administered 2021-06-18: 1000 mL via INTRAVENOUS

## 2021-06-18 MED ORDER — KETOROLAC TROMETHAMINE 10 MG PO TABS: 10.0000 mg | ORAL_TABLET | Freq: Four times a day (QID) | ORAL | 0 refills | Status: AC | PRN

## 2021-06-18 MED ORDER — KETOROLAC TROMETHAMINE 30 MG/ML IJ SOLN
30.0000 mg | Freq: Once | INTRAMUSCULAR | Status: AC
  Administered 2021-06-18: 30 mg via INTRAVENOUS
  Filled 2021-06-18: qty 1

## 2021-06-18 MED ORDER — MORPHINE SULFATE (PF) 4 MG/ML IV SOLN
4.0000 mg | Freq: Once | INTRAVENOUS | Status: AC
  Administered 2021-06-18: 4 mg via INTRAVENOUS
  Filled 2021-06-18: qty 1

## 2021-06-18 MED ORDER — ONDANSETRON 4 MG PO TBDP: 4.0000 mg | ORAL_TABLET | Freq: Three times a day (TID) | ORAL | 0 refills | Status: DC | PRN

## 2021-06-18 NOTE — ED Triage Notes (Signed)
Pt here with RLQ abd pain that started 2 hours ago. Pt states pain radiates along the lower part of his abd. Pt also having N/V.

## 2021-06-18 NOTE — ED Provider Notes (Signed)
[facility] Provider Note    Event Date/Time   First MD Initiated Contact with Patient 06/18/21 1731     (approximate)   History   Abdominal Pain   HPI  Christopher Castaneda is a 56 y.o. male with history of kidney stones, gout, GERD and obesity presents emergency department complaining of flank pain.  Patient had lithotripsy years ago for stone removal.  He states the pain started suddenly around 2 hours ago.  It is in the right lower quadrant.  He does still have his appendix.  No fever or chills.  Some nausea and vomiting secondary to pain.  No diarrhea.      Physical Exam   Triage Vital Signs: ED Triage Vitals [06/18/21 1507]  Enc Vitals Group     BP (!) 141/98     Pulse Rate 75     Resp 20     Temp 98 F (36.7 C)     Temp Source Oral     SpO2 99 %     Weight 215 lb (97.5 kg)     Height 5\' 8"  (1.727 m)     Head Circumference      Peak Flow      Pain Score 9     Pain Loc      Pain Edu?      Excl. in GC?     Most recent vital signs: Vitals:   06/18/21 1832 06/18/21 1954  BP: (!) 137/94 139/84  Pulse: 84 83  Resp: (!) 22 17  Temp:    SpO2: 100% 95%     General: Awake, no distress.   CV:  Good peripheral perfusion. regular rate and  rhythm Resp:  Normal effort.  Abd:  No distention.  Minimally tender in the lower abdomen Other:  Patient appears to be in a good deal of pain   ED Results / Procedures / Treatments   Labs (all labs ordered are listed, but only abnormal results are displayed) Labs Reviewed  COMPREHENSIVE METABOLIC PANEL - Abnormal; Notable for the following components:      Result Value   Glucose, Bld 184 (*)    All other components within normal limits  URINALYSIS, ROUTINE W REFLEX MICROSCOPIC - Abnormal; Notable for the following components:   Color, Urine YELLOW (*)    APPearance HAZY (*)    Hgb urine dipstick LARGE (*)    RBC / HPF >50 (*)    All other components within normal limits  LIPASE, BLOOD  CBC      EKG     RADIOLOGY CT renal stone    PROCEDURES:   Procedures   MEDICATIONS ORDERED IN ED: Medications  sodium chloride 0.9 % bolus 1,000 mL (0 mLs Intravenous Stopped 06/18/21 1951)  morphine (PF) 4 MG/ML injection 4 mg (4 mg Intravenous Given 06/18/21 1754)  ondansetron (ZOFRAN) injection 4 mg (4 mg Intravenous Given 06/18/21 1753)  HYDROmorphone (DILAUDID) injection 1 mg (1 mg Intravenous Given 06/18/21 1824)  ketorolac (TORADOL) 30 MG/ML injection 30 mg (30 mg Intravenous Given 06/18/21 1853)  sodium chloride 0.9 % bolus 1,000 mL (1,000 mLs Intravenous New Bag/Given 06/18/21 1954)     IMPRESSION / MDM / ASSESSMENT AND PLAN / ED COURSE  I reviewed the triage vital signs and the nursing notes.                              Differential diagnosis includes,  but is not limited to, kidney stone, acute appendicitis, abdominal pain, flank pain, pyelonephritis  Patient's presentation is most consistent with acute presentation with potential threat to life or bodily function.   Patient's labs are reassuring although he does have a large amount of hemoglobin and green 50 RBCs seen in his urine.  This may be most consistent with kidney stone.  CT renal stone study ordered  Medication for pain ordered, patient will be given a IV, morphine, Zofran and normal saline 1 L   CT interpreted by me for kidney stone.  Radiologist 6 mm obstructing stone at the UVJ.  Patient was given Toradol as the kidney stone is at the UVJ.  He does have some relief with this.  He had had no relief with morphine and Dilaudid.  Consult to urology, sent secure message and did call Dr. Lonna Cobb.  Dr. Lonna Cobb recommends that he follow-up in the office so they can schedule him for lithotripsy.  Give the patient a second liter of normal saline to help push fluids.  And also this will give the patient time to see if he has increased pain where he may need admission for pain control.  Care transferred to  Dr. Erma Heritage.  If patient is still feeling pain-free in 30 minutes he may be discharged home.  Medication sent to the pharmacy of Toradol, Flomax, and hydrocodone.  FINAL CLINICAL IMPRESSION(S) / ED DIAGNOSES   Final diagnoses:  Kidney stone     Rx / DC Orders   ED Discharge Orders          Ordered    ketorolac (TORADOL) 10 MG tablet  Every 6 hours PRN        06/18/21 2005    HYDROcodone-acetaminophen (NORCO/VICODIN) 5-325 MG tablet  Every 6 hours PRN        06/18/21 2005    tamsulosin (FLOMAX) 0.4 MG CAPS capsule  Daily        06/18/21 2005             Note:  This document was prepared using Dragon voice recognition software and may include unintentional dictation errors.    [name], PA-C 06/18/21 [name], MD 06/27/21 [phone]

## 2021-06-19 ENCOUNTER — Ambulatory Visit (INDEPENDENT_AMBULATORY_CARE_PROVIDER_SITE_OTHER): Payer: 59 | Admitting: Urology

## 2021-06-19 ENCOUNTER — Ambulatory Visit
Admission: RE | Admit: 2021-06-19 | Discharge: 2021-06-19 | Disposition: A | Discharge status: Home / Self Care | Payer: 59 | Source: Ambulatory Visit | Attending: Urology | Admitting: Urology

## 2021-06-19 ENCOUNTER — Encounter: Payer: Self-pay | Admitting: Urology

## 2021-06-19 VITALS — BP 125/78 | HR 73 | Ht 68.0 in | Wt 240.0 lb

## 2021-06-19 DIAGNOSIS — N1330 Unspecified hydronephrosis: Secondary | ICD-10-CM | POA: Diagnosis not present

## 2021-06-19 DIAGNOSIS — N132 Hydronephrosis with renal and ureteral calculous obstruction: Secondary | ICD-10-CM

## 2021-06-19 DIAGNOSIS — N200 Calculus of kidney: Secondary | ICD-10-CM

## 2021-06-19 DIAGNOSIS — N201 Calculus of ureter: Secondary | ICD-10-CM

## 2021-06-19 LAB — MICROSCOPIC EXAMINATION: Bacteria, UA: NONE SEEN

## 2021-06-19 LAB — URINALYSIS, COMPLETE
Bilirubin, UA: NEGATIVE
Glucose, UA: NEGATIVE
Ketones, UA: NEGATIVE
Leukocytes,UA: NEGATIVE
Nitrite, UA: NEGATIVE
Protein,UA: NEGATIVE
Specific Gravity, UA: 1.01 (ref 1.005–1.030)
Urobilinogen, Ur: 0.2 mg/dL (ref 0.2–1.0)
pH, UA: 5.5 (ref 5.0–7.5)

## 2021-06-19 NOTE — Progress Notes (Signed)
06/19/2021 2:28 PM   Christopher Castaneda October 09, 1965 [id number]  Referring provider: Marina GoodellFeldpausch, Christopher E, MD [address] [place],  [place] [id number]  Chief Complaint  Patient presents with   Nephrolithiasis    HPI: Christopher Castaneda is a 56 y.o. male who presents today in follow-up of recent ED visit for renal colic.  Acute onset of severe right flank pain 06/18/2021 necessitating ED visit Radiation right lower quadrant No identifiable precipitating, aggravating or alleviating factors No fever, chills Positive nausea/vomiting UA with >50 RBCs Stone protocol CT was performed which showed small, bilateral nonobstructing renal calculi and a 6 mm right UPJ calculus.  Incidentally noted to have cholelithiasis and a benign adrenal adenoma The CT report states the calculus was at the UVJ and patient was given parenteral morphine and Toradol due to location with good pain control He was discharged on oral ketorolac, Zofran and hydrocodone Prior history recurrent stone disease with 1 previous SWL.  He has passed his other calculi Pain is much improved and minimal at present He has had some urinary hesitancy and sensation incomplete emptying   PMH: Past Medical History:  Diagnosis Date   GERD (gastroesophageal reflux disease)    Gout    Kidney stones    Obesity    PONV (postoperative nausea and vomiting)    after lithotripsy   Sleep apnea    CPAP, has not used for several yrs. Reports caused by reflux   Vocal cord dysfunction     Surgical History: Past Surgical History:  Procedure Laterality Date   CHONDROPLASTY Right 12/02/2016   Procedure: CHONDROPLASTY;  Surgeon: Signa KellPatel, Sunny, MD;  [facility];  Service: Orthopedics;  Laterality: Right;   COLONOSCOPY     COLONOSCOPY N/A 01/04/2020   Procedure: COLONOSCOPY;  Surgeon: Toledo, Boykin Nearingeodoro K, MD;  Location: [facility];  Service: Gastroenterology;  Laterality: N/A;   COLONOSCOPY WITH ESOPHAGOGASTRODUODENOSCOPY  (EGD)     ESOPHAGOGASTRODUODENOSCOPY N/A 01/04/2020   Procedure: ESOPHAGOGASTRODUODENOSCOPY (EGD);  Surgeon: Toledo, Boykin Nearingeodoro K, MD;  Location: [facility];  Service: Gastroenterology;  Laterality: N/A;   HERNIA REPAIR     KNEE ARTHROSCOPY WITH MEDIAL MENISECTOMY Right 12/02/2016   Procedure: KNEE ARTHROSCOPY WITH PARTIAL MEDIAL MENISECTOMY;  Surgeon: Signa KellPatel, Sunny, MD;  [facility];  Service: Orthopedics;  Laterality: Right;   Lithrotripsy for kidney stone removal     SYNOVECTOMY Right 12/02/2016   Procedure: SYNOVECTOMY;  Surgeon: Signa KellPatel, Sunny, MD;  [facility];  Service: Orthopedics;  Laterality: Right;    Home Medications:  Allergies as of 06/19/2021       Reactions   Oxycodone Shortness Of Breath        Medication List        Accurate as of June 19, 2021  2:28 PM. If you have any questions, ask your nurse or doctor.          calcium carbonate 500 MG chewable tablet Commonly known as: TUMS - dosed in mg elemental calcium Chew 1 tablet by mouth as needed for indigestion or heartburn.   colchicine 0.6 MG tablet Take 0.6 mg by mouth daily.   HYDROcodone-acetaminophen 5-325 MG tablet Commonly known as: NORCO/VICODIN Take 1 tablet by mouth every 6 (six) hours as needed for moderate pain.   indomethacin 25 MG capsule Commonly known as: INDOCIN Take 25 mg by mouth as needed.   ketorolac 10 MG tablet Commonly known as: TORADOL Take 1 tablet (10 mg total) by mouth every 6 (six) hours  as needed.   ondansetron 4 MG disintegrating tablet Commonly known as: ZOFRAN-ODT Take 1 tablet (4 mg total) by mouth every 8 (eight) hours as needed for nausea or vomiting.   tamsulosin 0.4 MG Caps capsule Commonly known as: FLOMAX Take 1 capsule (0.4 mg total) by mouth daily.   valACYclovir 500 MG tablet Commonly known as: VALTREX Take 500 mg by mouth 2 (two) times daily as needed.        Allergies:  Allergies  Allergen Reactions    Oxycodone Shortness Of Breath    Family History: History reviewed. No pertinent family history.  Social History:  reports that he has never smoked. His smokeless tobacco use includes chew. He reports that he does not drink alcohol and does not use drugs.   Physical Exam: BP 125/78   Pulse 73   Ht 5\' 8"  (1.727 m)   Wt 240 lb (108.9 kg)   BMI 36.49 kg/m   Constitutional:  Alert and oriented, No acute distress. HEENT: [place] AT Respiratory: Normal respiratory effort, no increased work of breathing. Neurologic: Grossly intact, no focal deficits, moving all 4 extremities. Psychiatric: Normal mood and affect.  Laboratory Data:  Urinalysis    Component Value Date/Time   COLORURINE YELLOW (A) 06/18/2021 1511   APPEARANCEUR HAZY (A) 06/18/2021 1511   LABSPEC 1.017 06/18/2021 1511   PHURINE 5.0 06/18/2021 1511   GLUCOSEU NEGATIVE 06/18/2021 1511   HGBUR LARGE (A) 06/18/2021 1511   BILIRUBINUR NEGATIVE 06/18/2021 1511   KETONESUR NEGATIVE 06/18/2021 1511   PROTEINUR NEGATIVE 06/18/2021 1511   NITRITE NEGATIVE 06/18/2021 1511   LEUKOCYTESUR NEGATIVE 06/18/2021 1511    Lab Results  Component Value Date   BACTERIA NONE SEEN 06/18/2021    Pertinent Imaging: CT images were personally reviewed and interpreted.  There is minimal right hydronephrosis due to a 6 mm right UPJ calculus  CT Renal Stone Study  Narrative CLINICAL DATA:  Flank pain, kidney stone suspected  EXAM: CT ABDOMEN AND PELVIS WITHOUT CONTRAST  TECHNIQUE: Multidetector CT imaging of the abdomen and pelvis was performed following the standard protocol without IV contrast.  RADIATION DOSE REDUCTION: This exam was performed according to the departmental dose-optimization program which includes automated exposure control, adjustment of the mA and/or kV according to patient size and/or use of iterative reconstruction technique.  COMPARISON:  None Available.  FINDINGS: Lower chest: No acute  abnormality.  Hepatobiliary: Mild hepatic steatosis. No enhancing intrahepatic mass. No intra or extrahepatic biliary ductal dilation. Cholelithiasis without pericholecystic inflammatory change noted.  Pancreas: Unremarkable  Spleen: Unremarkable  Adrenals/Urinary Tract: The right adrenal gland is unremarkable. The left adrenal gland contains a 25 mm benign adrenal adenoma demonstrating a density of -6 Hounsfield units. Kidneys are normal in size and position. There is minimal right hydronephrosis and moderate perinephric stranding secondary to an obstructing 6 mm calculus at the right ureterovesicular junction. Superimposed punctate nonobstructing calculi are seen bilaterally measuring up to 3 mm. No hydronephrosis on the left. No additional ureteral calculi. The bladder is unremarkable.  Stomach/Bowel: Stomach is within normal limits. Appendix appears normal. No evidence of bowel wall thickening, distention, or inflammatory changes.  Vascular/Lymphatic: Aortic atherosclerosis. No enlarged abdominal or pelvic lymph nodes.  Reproductive: Prostate is unremarkable.  Other: No abdominal wall hernia or abnormality. No abdominopelvic ascites.  Musculoskeletal: No acute or significant osseous findings.  IMPRESSION: 1. Minimal right hydronephrosis and moderate perinephric stranding secondary to an obstructing 6 mm calculus at the right ureterovesicular junction. 2. Superimposed punctate nonobstructing calculi are  seen bilaterally measuring up to 3 mm. 3. Mild hepatic steatosis. 4. Cholelithiasis. 5. Benign left adrenal adenoma.  Aortic Atherosclerosis (ICD10-I70.0).   Electronically Signed By: Helyn Numbers M.D. On: 06/18/2021 18:24   Assessment & Plan:    1.  Right proximal ureteral calculus Pain significantly improved The appointment was made today for consideration of lithotripsy tomorrow however since he received Toradol lithotripsy would not be able to be  performed unless the stone has moved distal to the inferior border of the kidney KUB today and he will be notified with results Since he is minimally symptomatic at present he does desire a trial of passage The alternative of ureteroscopic stone removal was also discussed.   Riki Altes, MD  [facility] [address], [address] [place], [place] [address] [phone]

## 2021-06-20 ENCOUNTER — Encounter: Payer: Self-pay | Admitting: Urology

## 2021-06-20 ENCOUNTER — Telehealth: Payer: Self-pay | Admitting: Urology

## 2021-06-20 NOTE — Telephone Encounter (Signed)
Spoke to patient and he would like to be scheduled for Lithotripsy on Thursday. I informed him that he will need to come in to sign several papers and that Efraim Kaufmann will reach out to get it scheduled.

## 2021-06-20 NOTE — Telephone Encounter (Signed)
The stone is in the same position as it was on the CT scan earlier this week.  We can get him scheduled for shockwave lithotripsy next Thursday, I could do ureteroscopic stone removal this Tuesday.  The mobile lithotripter is in [place] on Monday however he would need an appointment with one of the [facility] providers before lithotripsy could be performed.

## 2021-06-20 NOTE — Telephone Encounter (Signed)
Patient's wife called the office today inquiring about the patient's xray results from yesterday afternoon, 6/14.  She is inquiring about next steps for treatment of his stone.  Please advise.

## 2021-06-21 ENCOUNTER — Other Ambulatory Visit: Payer: Self-pay | Admitting: Urology

## 2021-06-21 DIAGNOSIS — N200 Calculus of kidney: Secondary | ICD-10-CM

## 2021-06-21 NOTE — Progress Notes (Unsigned)
ESWL ORDER FORM  Expected date of procedure: 06/27/2021  Surgeon: John Giovanni, MD  Post op standing: 2-4wk follow up w/KUB prior  Anticoagulation/Aspirin/NSAID standing order: Hold all 72 hours prior  Anesthesia standing order: MAC  VTE standing: SCD's  Dx: Right Nephrolihtiasis  Procedure: Right extracorporeal shock wave lithotripsy  CPT : 16109  Standing Order Set:   *NPO after mn, KUB  *NS 168m/hr, Keflex 5077mPO, Benadryl 2578mO, Valium 60m68m, Zofran 4mg 39m   Medications if other than standing orders:   NONE

## 2021-06-21 NOTE — Telephone Encounter (Signed)
Spoke with pt. He is going to call on Monday to set up a time to come complete forms. Will you send me his Litho booking sheet?

## 2021-06-26 MED ORDER — ONDANSETRON HCL 4 MG/2ML IJ SOLN
4.0000 mg | Freq: Once | INTRAMUSCULAR | Status: AC
  Administered 2021-06-27: 4 mg via INTRAVENOUS

## 2021-06-26 MED ORDER — SODIUM CHLORIDE 0.9 % IV SOLN: INTRAVENOUS | Status: DC

## 2021-06-26 MED ORDER — DIAZEPAM 5 MG PO TABS
10.0000 mg | ORAL_TABLET | ORAL | Status: AC
  Administered 2021-06-27: 10 mg via ORAL

## 2021-06-26 MED ORDER — DIPHENHYDRAMINE HCL 25 MG PO CAPS
25.0000 mg | ORAL_CAPSULE | ORAL | Status: AC
  Administered 2021-06-27: 25 mg via ORAL

## 2021-06-26 MED ORDER — CEPHALEXIN 500 MG PO CAPS
500.0000 mg | ORAL_CAPSULE | Freq: Once | ORAL | Status: AC
  Administered 2021-06-27: 500 mg via ORAL

## 2021-06-27 ENCOUNTER — Ambulatory Visit
Admission: RE | Admit: 2021-06-27 | Discharge: 2021-06-27 | Disposition: A | Discharge status: Home / Self Care | Payer: 59 | Source: Ambulatory Visit | Attending: Urology | Admitting: Urology

## 2021-06-27 ENCOUNTER — Encounter
Admission: RE | Disposition: A | Discharge status: Home / Self Care | Payer: Self-pay | Source: Ambulatory Visit | Attending: Urology

## 2021-06-27 ENCOUNTER — Ambulatory Visit: Payer: 59

## 2021-06-27 ENCOUNTER — Encounter: Payer: Self-pay | Admitting: Urology

## 2021-06-27 ENCOUNTER — Other Ambulatory Visit: Payer: Self-pay

## 2021-06-27 DIAGNOSIS — N200 Calculus of kidney: Secondary | ICD-10-CM | POA: Insufficient documentation

## 2021-06-27 HISTORY — PX: EXTRACORPOREAL SHOCK WAVE LITHOTRIPSY: SHX1557

## 2021-06-27 SURGERY — LITHOTRIPSY, ESWL
Anesthesia: Moderate Sedation | Laterality: Right

## 2021-06-27 MED ORDER — DIPHENHYDRAMINE HCL 25 MG PO CAPS
ORAL_CAPSULE | ORAL | Status: AC
  Filled 2021-06-27: qty 1

## 2021-06-27 MED ORDER — CEPHALEXIN 500 MG PO CAPS
ORAL_CAPSULE | ORAL | Status: AC
  Filled 2021-06-27: qty 1

## 2021-06-27 MED ORDER — HYDROCODONE-ACETAMINOPHEN 5-325 MG PO TABS: 1.0000 | ORAL_TABLET | Freq: Four times a day (QID) | ORAL | 0 refills | Status: DC | PRN

## 2021-06-27 MED ORDER — ONDANSETRON HCL 4 MG/2ML IJ SOLN
INTRAMUSCULAR | Status: AC
  Filled 2021-06-27: qty 2

## 2021-06-27 MED ORDER — DIAZEPAM 5 MG PO TABS
ORAL_TABLET | ORAL | Status: AC
  Filled 2021-06-27: qty 2

## 2021-06-27 NOTE — Discharge Instructions (Addendum)
As per the [facility] discharge instructions A prescription for pain medication was sent to your pharmacy Continue tamsulosin which will help you pass stone fragments was also sent to your pharmacy Call [facility] at [phone] for pain not controlled with oral medications or fever greater than 101 degrees You have a follow-up appointment scheduled 07/17/2021   AMBULATORY SURGERY  DISCHARGE INSTRUCTIONS   The drugs that you were given will stay in your system until tomorrow so for the next 24 hours you should not:  Drive an automobile Make any legal decisions Drink any alcoholic beverage   You may resume regular meals tomorrow.  Today it is better to start with liquids and gradually work up to solid foods.  You may eat anything you prefer, but it is better to start with liquids, then soup and crackers, and gradually work up to solid foods.   Please notify your doctor immediately if you have any unusual bleeding, trouble breathing, redness and pain at the surgery site, drainage, fever, or pain not relieved by medication.    Additional Instructions:        Please contact your physician with any problems or Same Day Surgery at [phone], Monday through Friday 6 am to 4 pm, or [place] at [facility] number at [phone].

## 2021-06-28 ENCOUNTER — Encounter: Payer: Self-pay | Admitting: Urology

## 2021-06-28 ENCOUNTER — Other Ambulatory Visit: Payer: Self-pay

## 2021-06-28 DIAGNOSIS — N200 Calculus of kidney: Secondary | ICD-10-CM

## 2021-07-17 ENCOUNTER — Ambulatory Visit
Admission: RE | Admit: 2021-07-17 | Discharge: 2021-07-17 | Disposition: A | Discharge status: Home / Self Care | Payer: 59 | Source: Ambulatory Visit | Attending: Urology | Admitting: Urology

## 2021-07-17 ENCOUNTER — Ambulatory Visit (INDEPENDENT_AMBULATORY_CARE_PROVIDER_SITE_OTHER): Payer: 59 | Admitting: Physician Assistant

## 2021-07-17 ENCOUNTER — Ambulatory Visit
Admission: RE | Admit: 2021-07-17 | Discharge: 2021-07-17 | Disposition: A | Discharge status: Home / Self Care | Payer: 59 | Attending: Urology | Admitting: Urology

## 2021-07-17 VITALS — BP 120/79 | HR 69 | Ht 68.0 in | Wt 212.0 lb

## 2021-07-17 DIAGNOSIS — N200 Calculus of kidney: Secondary | ICD-10-CM | POA: Insufficient documentation

## 2021-07-17 DIAGNOSIS — N201 Calculus of ureter: Secondary | ICD-10-CM

## 2021-07-18 LAB — URINALYSIS, COMPLETE
Bilirubin, UA: NEGATIVE
Glucose, UA: NEGATIVE
Ketones, UA: NEGATIVE
Leukocytes,UA: NEGATIVE
Nitrite, UA: NEGATIVE
Protein,UA: NEGATIVE
RBC, UA: NEGATIVE
Specific Gravity, UA: 1.01 (ref 1.005–1.030)
Urobilinogen, Ur: 0.2 mg/dL (ref 0.2–1.0)
pH, UA: 5 (ref 5.0–7.5)

## 2021-07-18 LAB — MICROSCOPIC EXAMINATION
Bacteria, UA: NONE SEEN
RBC, Urine: NONE SEEN /hpf (ref 0–2)

## 2021-07-18 NOTE — Progress Notes (Signed)
07/17/2021 4:44 PM   Christopher Castaneda 22-May-1965 [id number]  CC: Chief Complaint  Patient presents with   Nephrolithiasis   HPI: Christopher Castaneda is a 56 y.o. male with PMH recurrent nephrolithiasis who underwent ESWL with Dr. Lonna Cobb on 06/27/2021 for management of a 6 mm right ureteral calculus who presents today for postop follow-up.  Operative note describes smudging of the stone.  Today he reports having passed several stone fragments at work, so he did not capture these for analysis.  He notes he passed them approximately 2 weeks after his ESWL.  His pain is resolved.  KUB today shows a possible residual fragment at the distal right ureter, however this does appear rather inferior to the anticipated path of the ureter and it may represent phlebolith.  His stone was poorly visualized on KUB the day of his procedure.  In-office UA and microscopy today pan negative.  PMH: Past Medical History:  Diagnosis Date   GERD (gastroesophageal reflux disease)    Gout    Kidney stones    Obesity    PONV (postoperative nausea and vomiting)    after lithotripsy   Sleep apnea    CPAP, has not used for several yrs. Reports caused by reflux   Vocal cord dysfunction     Surgical History: Past Surgical History:  Procedure Laterality Date   CHONDROPLASTY Right 12/02/2016   Procedure: CHONDROPLASTY;  Surgeon: Signa Kell, MD;  [facility];  Service: Orthopedics;  Laterality: Right;   COLONOSCOPY     COLONOSCOPY N/A 01/04/2020   Procedure: COLONOSCOPY;  Surgeon: Toledo, Boykin Nearing, MD;  Location: [facility];  Service: Gastroenterology;  Laterality: N/A;   COLONOSCOPY WITH ESOPHAGOGASTRODUODENOSCOPY (EGD)     ESOPHAGOGASTRODUODENOSCOPY N/A 01/04/2020   Procedure: ESOPHAGOGASTRODUODENOSCOPY (EGD);  Surgeon: Toledo, Boykin Nearing, MD;  Location: [facility];  Service: Gastroenterology;  Laterality: N/A;   EXTRACORPOREAL SHOCK WAVE LITHOTRIPSY Right 06/27/2021    Procedure: EXTRACORPOREAL SHOCK WAVE LITHOTRIPSY (ESWL);  Surgeon: Riki Altes, MD;  Location: [facility];  Service: Urology;  Laterality: Right;   HERNIA REPAIR     KNEE ARTHROSCOPY WITH MEDIAL MENISECTOMY Right 12/02/2016   Procedure: KNEE ARTHROSCOPY WITH PARTIAL MEDIAL MENISECTOMY;  Surgeon: Signa Kell, MD;  [facility];  Service: Orthopedics;  Laterality: Right;   Lithrotripsy for kidney stone removal     SYNOVECTOMY Right 12/02/2016   Procedure: SYNOVECTOMY;  Surgeon: Signa Kell, MD;  Location: [facility];  Service: Orthopedics;  Laterality: Right;    Home Medications:  Allergies as of 07/17/2021       Reactions   Oxycodone Shortness Of Breath        Medication List        Accurate as of July 17, 2021 11:59 PM. If you have any questions, ask your nurse or doctor.          calcium carbonate 500 MG chewable tablet Commonly known as: TUMS - dosed in mg elemental calcium Chew 1 tablet by mouth as needed for indigestion or heartburn.   colchicine 0.6 MG tablet Take 0.6 mg by mouth daily.   HYDROcodone-acetaminophen 5-325 MG tablet Commonly known as: NORCO/VICODIN Take 1 tablet by mouth every 6 (six) hours as needed for moderate pain.   indomethacin 25 MG capsule Commonly known as: INDOCIN Take 25 mg by mouth as needed.   ketorolac 10 MG tablet Commonly known as: TORADOL Take 1 tablet (10 mg total) by mouth every 6 (six) hours as needed.  ondansetron 4 MG disintegrating tablet Commonly known as: ZOFRAN-ODT Take 1 tablet (4 mg total) by mouth every 8 (eight) hours as needed for nausea or vomiting.   tamsulosin 0.4 MG Caps capsule Commonly known as: FLOMAX Take 1 capsule (0.4 mg total) by mouth daily.   valACYclovir 500 MG tablet Commonly known as: VALTREX Take 500 mg by mouth 2 (two) times daily as needed.        Allergies:  Allergies  Allergen Reactions   Oxycodone Shortness Of Breath    Family History: No family  history on file.  Social History:   reports that he has never smoked. His smokeless tobacco use includes chew. He reports that he does not drink alcohol and does not use drugs.  Physical Exam: BP 120/79   Pulse 69   Ht 5\' 8"  (1.727 m)   Wt 212 lb (96.2 kg)   BMI 32.23 kg/m   Constitutional:  Alert and oriented, no acute distress, nontoxic appearing HEENT: [place], AT Cardiovascular: No clubbing, cyanosis, or edema Respiratory: Normal respiratory effort, no increased work of breathing Skin: No rashes, bruises or suspicious lesions Neurologic: Grossly intact, no focal deficits, moving all 4 extremities Psychiatric: Normal mood and affect  Laboratory Data: Results for orders placed or performed in visit on 07/17/21  Microscopic Examination   Urine  Result Value Ref Range   WBC, UA 0-5 0 - 5 /hpf   RBC, Urine None seen 0 - 2 /hpf   Epithelial Cells (non renal) 0-10 0 - 10 /hpf   Bacteria, UA None seen None seen/Few  Urinalysis, Complete  Result Value Ref Range   Specific Gravity, UA 1.010 1.005 - 1.030   pH, UA 5.0 5.0 - 7.5   Color, UA Yellow Yellow   Appearance Ur Clear Clear   Leukocytes,UA Negative Negative   Protein,UA Negative Negative/Trace   Glucose, UA Negative Negative   Ketones, UA Negative Negative   RBC, UA Negative Negative   Bilirubin, UA Negative Negative   Urobilinogen, Ur 0.2 0.2 - 1.0 mg/dL   Nitrite, UA Negative Negative   Microscopic Examination See below:    Pertinent Imaging: KUB, 07/17/2021: CLINICAL DATA:  History of right nephrolithiasis. Status post lithotripsy.   EXAM: ABDOMEN - 1 VIEW   COMPARISON:  KUB 06/27/2021 and 06/19/2021. CT abdomen and pelvis 06/18/2021.   FINDINGS: A punctate calcification projecting over the lower pole of the right kidney may be the tiny stone seen on the prior CT. No other evidence of urinary tract stone is identified. Phleboliths in the pelvis are noted. Gallstone as seen on prior CT noted.    IMPRESSION: Punctate calcification over the lower pole of the right kidney may be the nonobstructing stone seen on prior CT. No other evidence of urinary tract stone is identified.   Gallstone.     Electronically Signed   By: 06/20/2021 M.D.   On: 07/19/2021 08:50  I personally reviewed the images referenced above and note a possible residual stone fragment versus phlebolith in the right inferior hemipelvis surrounded by other known phleboliths.  Assessment & Plan:   1. Right ureteral stone He has passed multiple fragments, unable to send these for analysis.  KUB today with calcific density in the right hemipelvis possibly representing retained fragment.  I recommended proceeding with renal ultrasound to assess for hydronephrosis or absent ureteral jet which would be consistent with retained fragment.  Notably, he is asymptomatic and his UA is clear today.  He is in agreement with  this plan. - Urinalysis, Complete - US RENAL; Future  Return for Will call with results.  Carman Ching, PA-C  [facility] [address] [place], [place] [address] [phone]

## 2022-02-25 ENCOUNTER — Emergency Department
Admission: EM | Admit: 2022-02-25 | Discharge: 2022-02-25 | Disposition: A | Discharge status: Home / Self Care | Payer: 59 | Attending: Emergency Medicine | Admitting: Emergency Medicine

## 2022-02-25 ENCOUNTER — Encounter: Payer: Self-pay | Admitting: Emergency Medicine

## 2022-02-25 ENCOUNTER — Emergency Department: Payer: 59

## 2022-02-25 ENCOUNTER — Other Ambulatory Visit: Payer: Self-pay

## 2022-02-25 DIAGNOSIS — R1011 Right upper quadrant pain: Secondary | ICD-10-CM

## 2022-02-25 DIAGNOSIS — K8050 Calculus of bile duct without cholangitis or cholecystitis without obstruction: Secondary | ICD-10-CM

## 2022-02-25 LAB — CBC WITH DIFFERENTIAL/PLATELET
Abs Immature Granulocytes: 0.07 10*3/uL (ref 0.00–0.07)
Basophils Absolute: 0.1 10*3/uL (ref 0.0–0.1)
Basophils Relative: 0 %
Eosinophils Absolute: 0 10*3/uL (ref 0.0–0.5)
Eosinophils Relative: 0 %
HCT: 45.3 % (ref 39.0–52.0)
Hemoglobin: 14.8 g/dL (ref 13.0–17.0)
Immature Granulocytes: 1 %
Lymphocytes Relative: 9 %
Lymphs Abs: 1.1 10*3/uL (ref 0.7–4.0)
MCH: 28.6 pg (ref 26.0–34.0)
MCHC: 32.7 g/dL (ref 30.0–36.0)
MCV: 87.6 fL (ref 80.0–100.0)
Monocytes Absolute: 0.4 10*3/uL (ref 0.1–1.0)
Monocytes Relative: 4 %
Neutro Abs: 10.7 10*3/uL — ABNORMAL HIGH (ref 1.7–7.7)
Neutrophils Relative %: 86 %
Platelets: 220 10*3/uL (ref 150–400)
RBC: 5.17 MIL/uL (ref 4.22–5.81)
RDW: 12.6 % (ref 11.5–15.5)
WBC: 12.4 10*3/uL — ABNORMAL HIGH (ref 4.0–10.5)
nRBC: 0 % (ref 0.0–0.2)

## 2022-02-25 LAB — COMPREHENSIVE METABOLIC PANEL
ALT: 39 U/L (ref 0–44)
AST: 24 U/L (ref 15–41)
Albumin: 4.7 g/dL (ref 3.5–5.0)
Alkaline Phosphatase: 73 U/L (ref 38–126)
Anion gap: 7 (ref 5–15)
BUN: 15 mg/dL (ref 6–20)
CO2: 26 mmol/L (ref 22–32)
Calcium: 9.1 mg/dL (ref 8.9–10.3)
Chloride: 101 mmol/L (ref 98–111)
Creatinine, Ser: 1.18 mg/dL (ref 0.61–1.24)
GFR, Estimated: >60 mL/min (ref >60)
Glucose, Bld: 178 mg/dL — ABNORMAL HIGH (ref 70–99)
Potassium: 4.1 mmol/L (ref 3.5–5.1)
Sodium: 134 mmol/L — ABNORMAL LOW (ref 135–145)
Total Bilirubin: 0.8 mg/dL (ref 0.3–1.2)
Total Protein: 7.7 g/dL (ref 6.5–8.1)

## 2022-02-25 LAB — URINALYSIS, ROUTINE W REFLEX MICROSCOPIC
Bilirubin Urine: NEGATIVE
Glucose, UA: NEGATIVE mg/dL
Hgb urine dipstick: NEGATIVE
Ketones, ur: NEGATIVE mg/dL
Leukocytes,Ua: NEGATIVE
Nitrite: NEGATIVE
Protein, ur: NEGATIVE mg/dL
Specific Gravity, Urine: 1.02 (ref 1.005–1.030)
pH: 5 (ref 5.0–8.0)

## 2022-02-25 LAB — LIPASE, BLOOD: Lipase: 43 U/L (ref 11–51)

## 2022-02-25 LAB — TROPONIN I (HIGH SENSITIVITY)
Troponin I (High Sensitivity): 4 ng/L (ref <18)
Troponin I (High Sensitivity): 4 ng/L (ref <18)

## 2022-02-25 MED ORDER — HYDROMORPHONE HCL 1 MG/ML IJ SOLN
1.0000 mg | Freq: Once | INTRAMUSCULAR | Status: DC
  Filled 2022-02-25: qty 1

## 2022-02-25 MED ORDER — HYDROCODONE-ACETAMINOPHEN 5-325 MG PO TABS: 1.0000 | ORAL_TABLET | Freq: Four times a day (QID) | ORAL | 0 refills | Status: AC | PRN

## 2022-02-25 MED ORDER — KETOROLAC TROMETHAMINE 30 MG/ML IJ SOLN
15.0000 mg | Freq: Once | INTRAMUSCULAR | Status: DC
  Filled 2022-02-25: qty 1

## 2022-02-25 MED ORDER — ONDANSETRON HCL 4 MG/2ML IJ SOLN
4.0000 mg | Freq: Once | INTRAMUSCULAR | Status: AC
  Administered 2022-02-25: 4 mg via INTRAVENOUS
  Filled 2022-02-25: qty 2

## 2022-02-25 MED ORDER — ONDANSETRON HCL 4 MG PO TABS
4.0000 mg | ORAL_TABLET | Freq: Three times a day (TID) | ORAL | 0 refills | Status: AC | PRN
Start: 2022-02-25 — End: ?

## 2022-02-25 MED ORDER — MORPHINE SULFATE (PF) 4 MG/ML IV SOLN
4.0000 mg | Freq: Once | INTRAVENOUS | Status: AC
  Administered 2022-02-25: 4 mg via INTRAVENOUS
  Filled 2022-02-25: qty 1

## 2022-02-25 NOTE — ED Provider Notes (Addendum)
[facility] Provider Note    Event Date/Time   First MD Initiated Contact with Patient 02/25/22 [phone]     (approximate)   History   Abdominal Pain   HPI  Christopher Castaneda is a 57 y.o. male past medical history of GERD, kidney stones, obesity who presents with upper abdominal pain.  Symptoms started around 1 AM.  Pain is located in the upper abdomen but occasionally feels it diffusely as well.  Has had several episodes of emesis.  Also has been having diarrhea for almost 9 days.  He does have some pain that radiates up into the chest.  Says he has had about 4 total episodes of similar pain in the last 2 months.  Often brought on by eating greasy or spicy food.  Did not eat anything out of the ordinary yesterday.  He denies fevers chills.  No urinary symptoms.     Past Medical History:  Diagnosis Date   GERD (gastroesophageal reflux disease)    Gout    Kidney stones    Obesity    PONV (postoperative nausea and vomiting)    after lithotripsy   Sleep apnea    CPAP, has not used for several yrs. Reports caused by reflux   Vocal cord dysfunction     There are no problems to display for this patient.    Physical Exam  Triage Vital Signs: ED Triage Vitals [02/25/22 0449]  Enc Vitals Group     BP      Pulse      Resp      Temp      Temp src      SpO2      Weight 210 lb (95.3 kg)     Height 5' 7"$  (1.702 m)     Head Circumference      Peak Flow      Pain Score 7     Pain Loc      Pain Edu?      Excl. in [place]?     Most recent vital signs: Vitals:   02/25/22 0600 02/25/22 0630  BP: 135/83 (!) 140/83  Pulse: (!) 53 69  Resp: 14 18  Temp:    SpO2:       General: Awake, no distress.  CV:  Good peripheral perfusion.  Resp:  Normal effort.  Abd:  No distention.  Mild tenderness in the upper abdomen epigastrium right upper quadrant and left upper quadrant without guarding Neuro:             Awake, Alert, Oriented x 3  Other:     ED  Results / Procedures / Treatments  Labs (all labs ordered are listed, but only abnormal results are displayed) Labs Reviewed  CBC WITH DIFFERENTIAL/PLATELET - Abnormal; Notable for the following components:      Result Value   WBC 12.4 (*)    Neutro Abs 10.7 (*)    All other components within normal limits  COMPREHENSIVE METABOLIC PANEL - Abnormal; Notable for the following components:   Sodium 134 (*)    Glucose, Bld 178 (*)    All other components within normal limits  URINALYSIS, ROUTINE W REFLEX MICROSCOPIC - Abnormal; Notable for the following components:   Color, Urine YELLOW (*)    APPearance CLEAR (*)    All other components within normal limits  LIPASE, BLOOD  TROPONIN I (HIGH SENSITIVITY)  TROPONIN I (HIGH SENSITIVITY)  TROPONIN I (HIGH SENSITIVITY)  EKG  EKG reviewed interpreted myself shows sinus bradycardia normal axis normal intervals no acute ischemic changes   RADIOLOGY I performed and interpreted bedside ultrasound of the gallbladder which shows dilated gallbladder with gallstones, negative Murphy sign, no clear pericholecystic fluid, normal gallbladder wall thickness   PROCEDURES:  Critical Care performed: No  Procedures  The patient is on the cardiac monitor to evaluate for evidence of arrhythmia and/or significant heart rate changes.   MEDICATIONS ORDERED IN ED: Medications  HYDROmorphone (DILAUDID) injection 1 mg (has no administration in time range)  morphine (PF) 4 MG/ML injection 4 mg (4 mg Intravenous Given 02/25/22 0535)  ondansetron (ZOFRAN) injection 4 mg (4 mg Intravenous Given 02/25/22 0535)     IMPRESSION / MDM / ASSESSMENT AND PLAN / ED COURSE  I reviewed the triage vital signs and the nursing notes.                              Patient's presentation is most consistent with acute complicated illness / injury requiring diagnostic workup.  Differential diagnosis includes, but is not limited to, biliary colic, cholecystitis,  pancreatitis, gastritis, peptic ulcer, MI  Patient is a 57 year old male presents with upper abdominal pain that woke him from sleep around 1 AM.  Has been constant since onset.  Has had several episodes of emesis.  Patient has had 4 total episodes in the last 2 months typically brought on by food but he has not sought care for them because they have gone away.  Tells me he has been told he has gallstones seen on prior CT renal study.  Patient's vitals are overall reassuring he is mildly hypertensive and bradycardic.  Overall he looks well he is tender in both upper quadrants and epigastric region.  Differentials as above.  I am suspicious of biliary colic.  Will give morphine for pain and Zofran for nausea.  I looked at the patient's gallbladder on ultrasound.  It is somewhat dilated there are certainly gallstones.  I am not seeing obvious gallbladder wall thickening or pericholecystic fluid but I think his pain is likely related to the biliary tract.  Will get radiology ultrasound and see how he responds to pain medication and what his labs show.  Radiology ultrasound read pending at time of signout.  Patient's pain is improved but does have ongoing pain.  Will give another dose of IV Dilaudid.  He is requesting ginger ale now.    Signed out to oncoming rider.  If pain adequately controlled can be discharged with general surgery follow-up.       FINAL CLINICAL IMPRESSION(S) / ED DIAGNOSES   Final diagnoses:  RUQ pain     Rx / DC Orders   ED Discharge Orders     None        Note:  This document was prepared using Dragon voice recognition software and may include unintentional dictation errors.   Rada Hay, MD 02/25/22 [id number]    Rada Hay, MD 02/25/22 0730

## 2022-02-25 NOTE — ED Triage Notes (Signed)
Patient ambulatory to triage with steady gait, without difficulty or distress noted; pt reports upper abd pain accomp by N/V; st took gas-x and tums without relief

## 2022-02-25 NOTE — Discharge Instructions (Signed)
You have gallstones which is likely causing your pain.  Please follow-up with general surgery for elective removal of your gallbladder.  If your pain returns and is not going away you develop fevers or not able to keep fluids down please return to the emergency department.

## 2022-03-04 ENCOUNTER — Ambulatory Visit: Payer: Self-pay | Admitting: General Surgery

## 2022-03-04 NOTE — H&P (Signed)
PATIENT PROFILE: Christopher Castaneda is a 57 y.o. male who presents to the Clinic for consultation at the request of Dr. Starleen Blue for evaluation of cholelithiasis.  PCP:  Loa Socks., MD  HISTORY OF PRESENT ILLNESS: Christopher Castaneda reports he went to the ED on 02/25/2022 due to severe upper abdominal pain.  He endorses that pain wakes him up from sleep.  He tried to take some Tums to see if the pain went away but it actually got more intense.  He ended up in the ED for further evaluation.  At the ED he had ultrasound of the abdomen that shows cholelithiasis without sign of cholecystitis.  [name] the images.  There was normal bilirubin level.  Patient endorses that this has been at least a fourth or fifth attack of these right upper current pain.  Patient endorses that pain is aggravated by certain time of food.  No alleviating factors.  No pain radiation.  Denies any fever or chills.   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  Positive for abdominal pain Musculoskeletal ROS: negative for - joint swelling or muscle pain Neurological ROS: negative for -  confusion, syncope Dermatological ROS: negative for pruritus and rash Psychiatric: negative for anxiety, depression, difficulty sleeping and memory loss  MEDICATIONS: Current Outpatient Medications  Medication Sig Dispense Refill   CALCIUM CARBONATE (TUMS ORAL) Take by mouth as needed       colchicine (COLCRYS) 0.6 mg tablet Take 2 tablets (1.'2mg'$ ) by mouth at first sign of gout flare followed by 1 tablet (0.'6mg'$ ) after 1 hour. (Max 1.'8mg'$  within 1 hour) 10 tablet 2   HYDROcodone-acetaminophen (NORCO) 5-325 mg tablet Take 1 tablet by mouth every 6 (six) hours as needed for Pain     indomethacin (INDOCIN) 25 MG capsule Take 25 mg by mouth 2 (two) times daily as needed     ondansetron (ZOFRAN) 4 MG tablet Take 4 mg by mouth every 8 (eight) hours as needed     valACYclovir (VALTREX) 500 MG tablet Take 1 tablet (500 mg total) by mouth 2 (two) times daily as needed 10 tablet 1   No current facility-administered medications for this visit.    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 Kurtis Bushman @ [facility] - Hyperplastic Polyp    COLONOSCOPY  12/17/2012   Dr. Mamie Nick. Oh @ [place], [facility](f), rpt 5 yrs per PYO     torn meniscus of knee Right 12/02/2016   EGD  11/18/2017   LA Grade B reflux esophagitis.  Done by Dr. Donnella Sham.   COLONOSCOPY  01/04/2020   Diverticulosis/FHx CC-Father/Repeat 80yr/TKT   EGD  01/04/2020   GERD/Fundic gland polyp/No Repeat/TKT   HERNIA REPAIR     LITHOTRIPSY       FAMILY HISTORY: Family History  Problem Relation Age of Onset   Rectal cancer Father      SOCIAL  HISTORY: Social History   Socioeconomic History   Marital status: Married    Spouse name: ERaquel Sarna Occupational History   Occupation: Distribution Lineman with [employer]  Tobacco Use   Smoking status: Never   Smokeless tobacco: Current    Types: Snuff, Chew  Vaping Use   Vaping Use: Never used  Substance and Sexual Activity   Alcohol use: No    Alcohol/week: 0.0 standard drinks of alcohol   Drug use: Never   Sexual activity: Yes    Partners: Female    Birth control/protection: None    PHYSICAL EXAM: Vitals:   03/04/22 1434  BP: (!) 145/88  Pulse: 76   Body mass index is 28.7 kg/m. Weight: 90.7 kg (200 lb)   GENERAL: Alert, active, oriented x3  HEENT: Pupils equal reactive to light. Extraocular movements are intact. Sclera clear. Palpebral conjunctiva normal red color.Pharynx clear.  NECK: Supple with no palpable mass and no adenopathy.  LUNGS: Sound clear with no rales rhonchi or wheezes.  HEART: Regular rhythm S1 and S2 without murmur.  ABDOMEN: Soft and depressible, nontender with no palpable mass, no hepatomegaly.   EXTREMITIES: Well-developed well-nourished symmetrical with no dependent edema.  NEUROLOGICAL: Awake alert oriented, facial expression symmetrical, moving all extremities.  REVIEW OF DATA: I have reviewed the following data today: No visits with results within 3 Month(s) from this visit.  Latest known visit with results is:  Office Visit on 05/26/2019  Component Date Value   WBC (White Blood Cell Co* 05/26/2019 6.9    RBC (Red Blood Cell Coun* 05/26/2019 5.07    Hemoglobin 05/26/2019 14.8    Hematocrit 05/26/2019 45.8    MCV (Mean Corpuscular Vo* 05/26/2019 90.3    MCH (Mean Corpuscular He* 05/26/2019 29.2    MCHC (Mean Corpuscular H* 05/26/2019 32.3    Platelet Count 05/26/2019 187    RDW-CV (Red Cell Distrib* 05/26/2019 12.9    MPV (Mean Platelet Volum* 05/26/2019 10.6    Neutrophils 05/26/2019 4.60    Lymphocytes 05/26/2019 1.70     Mixed Count 05/26/2019 0.60    Neutrophil % 05/26/2019 66.5    Lymphocyte % 05/26/2019 24.5    Mixed % 05/26/2019 9.0    Glucose 05/26/2019 103    Sodium 05/26/2019 140    Potassium 05/26/2019 4.5    Chloride 05/26/2019 103    Carbon Dioxide (CO2) 05/26/2019 29.6    Urea Nitrogen (BUN) 05/26/2019 13    Creatinine 05/26/2019 1.0    Glomerular Filtration Ra* 05/26/2019 79    Calcium 05/26/2019 9.4    AST  05/26/2019 15    ALT  05/26/2019 21    Alk Phos (alkaline Phosp* 05/26/2019 71    Albumin 05/26/2019 4.5    Bilirubin, Total 05/26/2019 0.7    Protein, Total 05/26/2019 7.2    A/G Ratio 05/26/2019 1.7  Cholesterol, Total 05/26/2019 200    Triglyceride 05/26/2019 129    HDL (High Density Lipopr* 05/26/2019 41.4    LDL Calculated 05/26/2019 133 (H)    VLDL Cholesterol 05/26/2019 26    Cholesterol/HDL Ratio 05/26/2019 4.8    PSA (Prostate Specific A* 05/26/2019 0.69    Thyroid Stimulating Horm* 05/26/2019 1.003    Uric Acid 05/26/2019 8.9 (H)      ASSESSMENT: Mr. Hennigan is a 57 y.o. male presenting for consultation for cholelithiasis.    Patient was oriented about the diagnosis of cholelithiasis. Also oriented about what is the gallbladder, its anatomy and function and the implications of having stones. The patient was oriented about the treatment alternatives (observation vs cholecystectomy). Patient was oriented that a low percentage of patient will continue to have similar pain symptoms even after the gallbladder is removed. Surgical technique (open vs laparoscopic) was discussed. It was also discussed the goals of the surgery (decrease the pain episodes and avoid the risk of cholecystitis) and the risk of surgery including: bleeding, infection, common bile duct injury, stone retention, injury to other organs such as bowel, liver, stomach, other complications such as hernia, bowel obstruction among others. Also discussed with patient about anesthesia and its complications such  as: reaction to medications, pneumonia, heart complications, death, among others.   Cholelithiasis without cholecystitis [K80.20]  PLAN: 1.  Robotic assisted laparoscopic cholecystectomy [id number]) 2.  CBC, CMP (done) 3.  Do not take aspirin 5 days before the procedure 4.  Contact us if has any question or concern.   Patient and his wife verbalized understanding, all questions were answered, and were agreeable with the plan outlined above.     Herbert Pun, MD  Electronically signed by Herbert Pun, MD

## 2022-03-04 NOTE — H&P (View-Only) (Signed)
PATIENT PROFILE: Christopher Castaneda is a 56 y.o. male who presents to the Clinic for consultation at the request of Dr. Mchugh for evaluation of cholelithiasis.  PCP:  Feldpausch, Dale Eldred Jr., MD  HISTORY OF PRESENT ILLNESS: Christopher Castaneda reports he went to the ED on 02/25/2022 due to severe upper abdominal pain.  He endorses that pain wakes him up from sleep.  He tried to take some Tums to see if the pain went away but it actually got more intense.  He ended up in the ED for further evaluation.  At the ED he had ultrasound of the abdomen that shows cholelithiasis without sign of cholecystitis.  [name] the images.  There was normal bilirubin level.  Patient endorses that this has been at least a fourth or fifth attack of these right upper current pain.  Patient endorses that pain is aggravated by certain time of food.  No alleviating factors.  No pain radiation.  Denies any fever or chills.   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  Positive for abdominal pain Musculoskeletal ROS: negative for - joint swelling or muscle pain Neurological ROS: negative for -  confusion, syncope Dermatological ROS: negative for pruritus and rash Psychiatric: negative for anxiety, depression, difficulty sleeping and memory loss  MEDICATIONS: Current Outpatient Medications  Medication Sig Dispense Refill   CALCIUM CARBONATE (TUMS ORAL) Take by mouth as needed       colchicine (COLCRYS) 0.6 mg tablet Take 2 tablets (1.2mg) by mouth at first sign of gout flare followed by 1 tablet (0.6mg) after 1 hour. (Max 1.8mg within 1 hour) 10 tablet 2   HYDROcodone-acetaminophen (NORCO) 5-325 mg tablet Take 1 tablet by mouth every 6 (six) hours as needed for Pain     indomethacin (INDOCIN) 25 MG capsule Take 25 mg by mouth 2 (two) times daily as needed     ondansetron (ZOFRAN) 4 MG tablet Take 4 mg by mouth every 8 (eight) hours as needed     valACYclovir (VALTREX) 500 MG tablet Take 1 tablet (500 mg total) by mouth 2 (two) times daily as needed 10 tablet 1   No current facility-administered medications for this visit.    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 M. Skulskie @ [facility] - Hyperplastic Polyp    COLONOSCOPY  12/17/2012   Dr. P. Oh @ [facility] - Nml, [facility](f), rpt 5 yrs per PYO     torn meniscus of knee Right 12/02/2016   EGD  11/18/2017   LA Grade B reflux esophagitis.  Done by Dr. Skulski.   COLONOSCOPY  01/04/2020   Diverticulosis/FHx CC-Father/Repeat 5yrs/TKT   EGD  01/04/2020   GERD/Fundic gland polyp/No Repeat/TKT   HERNIA REPAIR     LITHOTRIPSY       FAMILY HISTORY: Family History  Problem Relation Age of Onset   Rectal cancer Father      SOCIAL  HISTORY: Social History   Socioeconomic History   Marital status: Married    Spouse name: Emily  Occupational History   Occupation: Distribution Lineman with [employer]  Tobacco Use   Smoking status: Never   Smokeless tobacco: Current    Types: Snuff, Chew  Vaping Use   Vaping Use: Never used  Substance and Sexual Activity   Alcohol use: No    Alcohol/week: 0.0 standard drinks of alcohol   Drug use: Never   Sexual activity: Yes    Partners: Female    Birth control/protection: None    PHYSICAL EXAM: Vitals:   03/04/22 1434  BP: (!) 145/88  Pulse: 76   Body mass index is 28.7 kg/m. Weight: 90.7 kg (200 lb)   GENERAL: Alert, active, oriented x3  HEENT: Pupils equal reactive to light. Extraocular movements are intact. Sclera clear. Palpebral conjunctiva normal red color.Pharynx clear.  NECK: Supple with no palpable mass and no adenopathy.  LUNGS: Sound clear with no rales rhonchi or wheezes.  HEART: Regular rhythm S1 and S2 without murmur.  ABDOMEN: Soft and depressible, nontender with no palpable mass, no hepatomegaly.   EXTREMITIES: Well-developed well-nourished symmetrical with no dependent edema.  NEUROLOGICAL: Awake alert oriented, facial expression symmetrical, moving all extremities.  REVIEW OF DATA: I have reviewed the following data today: No visits with results within 3 Month(s) from this visit.  Latest known visit with results is:  Office Visit on 05/26/2019  Component Date Value   WBC (White Blood Cell Co* 05/26/2019 6.9    RBC (Red Blood Cell Coun* 05/26/2019 5.07    Hemoglobin 05/26/2019 14.8    Hematocrit 05/26/2019 45.8    MCV (Mean Corpuscular Vo* 05/26/2019 90.3    MCH (Mean Corpuscular He* 05/26/2019 29.2    MCHC (Mean Corpuscular H* 05/26/2019 32.3    Platelet Count 05/26/2019 187    RDW-CV (Red Cell Distrib* 05/26/2019 12.9    MPV (Mean Platelet Volum* 05/26/2019 10.6    Neutrophils 05/26/2019 4.60    Lymphocytes 05/26/2019 1.70     Mixed Count 05/26/2019 0.60    Neutrophil % 05/26/2019 66.5    Lymphocyte % 05/26/2019 24.5    Mixed % 05/26/2019 9.0    Glucose 05/26/2019 103    Sodium 05/26/2019 140    Potassium 05/26/2019 4.5    Chloride 05/26/2019 103    Carbon Dioxide (CO2) 05/26/2019 29.6    Urea Nitrogen (BUN) 05/26/2019 13    Creatinine 05/26/2019 1.0    Glomerular Filtration Ra* 05/26/2019 79    Calcium 05/26/2019 9.4    AST  05/26/2019 15    ALT  05/26/2019 21    Alk Phos (alkaline Phosp* 05/26/2019 71    Albumin 05/26/2019 4.5    Bilirubin, Total 05/26/2019 0.7    Protein, Total 05/26/2019 7.2    A/G Ratio 05/26/2019 1.7      Cholesterol, Total 05/26/2019 200    Triglyceride 05/26/2019 129    HDL (High Density Lipopr* 05/26/2019 41.4    LDL Calculated 05/26/2019 133 (H)    VLDL Cholesterol 05/26/2019 26    Cholesterol/HDL Ratio 05/26/2019 4.8    PSA (Prostate Specific A* 05/26/2019 0.69    Thyroid Stimulating Horm* 05/26/2019 1.003    Uric Acid 05/26/2019 8.9 (H)      ASSESSMENT: Christopher Castaneda is a 56 y.o. male presenting for consultation for cholelithiasis.    Patient was oriented about the diagnosis of cholelithiasis. Also oriented about what is the gallbladder, its anatomy and function and the implications of having stones. The patient was oriented about the treatment alternatives (observation vs cholecystectomy). Patient was oriented that a low percentage of patient will continue to have similar pain symptoms even after the gallbladder is removed. Surgical technique (open vs laparoscopic) was discussed. It was also discussed the goals of the surgery (decrease the pain episodes and avoid the risk of cholecystitis) and the risk of surgery including: bleeding, infection, common bile duct injury, stone retention, injury to other organs such as bowel, liver, stomach, other complications such as hernia, bowel obstruction among others. Also discussed with patient about anesthesia and its complications such  as: reaction to medications, pneumonia, heart complications, death, among others.   Cholelithiasis without cholecystitis [K80.20]  PLAN: 1.  Robotic assisted laparoscopic cholecystectomy ([id number]) 2.  CBC, CMP (done) 3.  Do not take aspirin 5 days before the procedure 4.  Contact us if has any question or concern.   Patient and his wife verbalized understanding, all questions were answered, and were agreeable with the plan outlined above.     Izabela Ow Cintron-Diaz, MD  Electronically signed by Brennan Karam Cintron-Diaz, MD  

## 2022-03-07 ENCOUNTER — Other Ambulatory Visit: Payer: Self-pay

## 2022-03-07 ENCOUNTER — Encounter
Admission: RE | Admit: 2022-03-07 | Discharge: 2022-03-07 | Disposition: A | Discharge status: Home / Self Care | Payer: 59 | Source: Ambulatory Visit | Attending: General Surgery | Admitting: General Surgery

## 2022-03-07 HISTORY — DX: Pneumonia, unspecified organism: J18.9

## 2022-03-07 HISTORY — DX: Personal history of urinary calculi: Z87.442

## 2022-03-07 HISTORY — DX: Unspecified osteoarthritis, unspecified site: M19.90

## 2022-03-07 NOTE — Patient Instructions (Addendum)
Your procedure is scheduled on: 03/14/22 - Friday Report to the Registration Desk on the 1st floor of the [facility]. To find out your arrival time, please call [phone] between 1PM - 3PM on: 03/13/22 - Thursday If your arrival time is 6:00 am, do not arrive before that time as the [facility] entrance doors do not open until 6:00 am.  REMEMBER: Instructions that are not followed completely may result in serious medical risk, up to and including death; or upon the discretion of your surgeon and anesthesiologist your surgery may need to be rescheduled.  Do not eat food or drink any liquids after midnight the night before surgery.  No gum chewing or hard candies.   One week prior to surgery: Hold Beginning 03/07/22: Stop ketorolac (TORADOL)  and Anti-inflammatories (NSAIDS) such as Advil, Aleve, Ibuprofen, Motrin, Naproxen, Naprosyn and Aspirin based products such as Excedrin, Goody's Powder, BC Powder.  Stop ANY OVER THE COUNTER supplements until after surgery.  You may however, continue to take Tylenol if needed for pain up until the day of surgery.   TAKE ONLY THESE MEDICATIONS THE MORNING OF SURGERY WITH A SIP OF WATER:  none   No Alcohol for 24 hours before or after surgery.  No Smoking including e-cigarettes for 24 hours before surgery.  No chewable tobacco products for at least 6 hours before surgery.  No nicotine patches on the day of surgery.  Do not use any "recreational" drugs for at least a week (preferably 2 weeks) before your surgery.  Please be advised that the combination of cocaine and anesthesia may have negative outcomes, up to and including death. If you test positive for cocaine, your surgery will be cancelled.  On the morning of surgery brush your teeth with toothpaste and water, you may rinse your mouth with mouthwash if you wish. Do not swallow any toothpaste or mouthwash.  Use CHG Soap or wipes as directed on instruction sheet.  Do not wear  jewelry, make-up, hairpins, clips or nail polish.  Do not wear lotions, powders, or perfumes.   Do not shave body hair from the neck down 48 hours before surgery.  Contact lenses, hearing aids and dentures may not be worn into surgery.  Do not bring valuables to the hospital. [facility] is not responsible for any missing/lost belongings or valuables.   Bring your C-PAP to the hospital in case you may have to spend the night.   Notify your doctor if there is any change in your medical condition (cold, fever, infection).  Wear comfortable clothing (specific to your surgery type) to the hospital.  After surgery, you can help prevent lung complications by doing breathing exercises.  Take deep breaths and cough every 1-2 hours. Your doctor may order a device called an Incentive Spirometer to help you take deep breaths. When coughing or sneezing, hold a pillow firmly against your incision with both hands. This is called "splinting." Doing this helps protect your incision. It also decreases belly discomfort.  If you are being admitted to the hospital overnight, leave your suitcase in the car. After surgery it may be brought to your room.  In case of increased patient census, it may be necessary for you, the patient, to continue your postoperative care in the Same Day Surgery department.  If you are being discharged the day of surgery, you will not be allowed to drive home. You will need a responsible individual to drive you home and stay with you for 24 hours  after surgery.   If you are taking public transportation, you will need to have a responsible individual with you.  Please call the [facility] Dept. at [phone] if you have any questions about these instructions.  Surgery Visitation Policy:  Patients undergoing a surgery or procedure may have two family members or support persons with them as long as the person is not COVID-19 positive or experiencing its symptoms.    Inpatient Visitation:    Visiting hours are 7 a.m. to 8 p.m. Up to four visitors are allowed at one time in a patient room. The visitors may rotate out with other people during the day. One designated support person (adult) may remain overnight.  Due to an increase in RSV and influenza rates and associated hospitalizations, children ages 52 and under will not be able to visit patients in [facility]. Masks continue to be strongly recommended.    Preparing for Surgery with CHLORHEXIDINE GLUCONATE (CHG) Soap  Chlorhexidine Gluconate (CHG) Soap  o An antiseptic cleaner that kills germs and bonds with the skin to continue killing germs even after washing  o Used for showering the night before surgery and morning of surgery  Before surgery, you can play an important role by reducing the number of germs on your skin.  CHG (Chlorhexidine gluconate) soap is an antiseptic cleanser which kills germs and bonds with the skin to continue killing germs even after washing.  Please do not use if you have an allergy to CHG or antibacterial soaps. If your skin becomes reddened/irritated stop using the CHG.  1. Shower the NIGHT BEFORE SURGERY and the MORNING OF SURGERY with CHG soap.  2. If you choose to wash your hair, wash your hair first as usual with your normal shampoo.  3. After shampooing, rinse your hair and body thoroughly to remove the shampoo.  4. Use CHG as you would any other liquid soap. You can apply CHG directly to the skin and wash gently with a scrungie or a clean washcloth.  5. Apply the CHG soap to your body only from the neck down. Do not use on open wounds or open sores. Avoid contact with your eyes, ears, mouth, and genitals (private parts). Wash face and genitals (private parts) with your normal soap.  6. Wash thoroughly, paying special attention to the area where your surgery will be performed.  7. Thoroughly rinse your body with warm water.  8. Do not  shower/wash with your normal soap after using and rinsing off the CHG soap.  9. Pat yourself dry with a clean towel.  10. Wear clean pajamas to bed the night before surgery.  12. Place clean sheets on your bed the night of your first shower and do not sleep with pets.  13. Shower again with the CHG soap on the day of surgery prior to arriving at the hospital.  14. Do not apply any deodorants/lotions/powders.  15. Please wear clean clothes to the hospital.

## 2022-03-14 ENCOUNTER — Encounter: Payer: Self-pay | Admitting: General Surgery

## 2022-03-14 ENCOUNTER — Ambulatory Visit
Admission: RE | Admit: 2022-03-14 | Discharge: 2022-03-14 | Disposition: A | Discharge status: Home / Self Care | Payer: 59 | Source: Ambulatory Visit | Attending: General Surgery | Admitting: General Surgery

## 2022-03-14 ENCOUNTER — Ambulatory Visit: Payer: 59 | Admitting: Registered Nurse

## 2022-03-14 ENCOUNTER — Other Ambulatory Visit: Payer: Self-pay

## 2022-03-14 ENCOUNTER — Encounter
Admission: RE | Disposition: A | Discharge status: Home / Self Care | Payer: Self-pay | Source: Ambulatory Visit | Attending: General Surgery

## 2022-03-14 DIAGNOSIS — K8010 Calculus of gallbladder with chronic cholecystitis without obstruction: Secondary | ICD-10-CM | POA: Diagnosis not present

## 2022-03-14 DIAGNOSIS — G4730 Sleep apnea, unspecified: Secondary | ICD-10-CM | POA: Insufficient documentation

## 2022-03-14 DIAGNOSIS — F17220 Nicotine dependence, chewing tobacco, uncomplicated: Secondary | ICD-10-CM | POA: Diagnosis not present

## 2022-03-14 DIAGNOSIS — E669 Obesity, unspecified: Secondary | ICD-10-CM | POA: Insufficient documentation

## 2022-03-14 DIAGNOSIS — K219 Gastro-esophageal reflux disease without esophagitis: Secondary | ICD-10-CM | POA: Insufficient documentation

## 2022-03-14 DIAGNOSIS — K8020 Calculus of gallbladder without cholecystitis without obstruction: Secondary | ICD-10-CM | POA: Diagnosis present

## 2022-03-14 SURGERY — CHOLECYSTECTOMY, ROBOT-ASSISTED, LAPAROSCOPIC
Anesthesia: General | Site: Abdomen

## 2022-03-14 MED ORDER — FENTANYL CITRATE (PF) 100 MCG/2ML IJ SOLN
INTRAMUSCULAR | Status: AC
  Filled 2022-03-14: qty 2

## 2022-03-14 MED ORDER — EPHEDRINE SULFATE (PRESSORS) 50 MG/ML IJ SOLN
INTRAMUSCULAR | Status: DC | PRN
  Administered 2022-03-14: 10 mg via INTRAVENOUS

## 2022-03-14 MED ORDER — FAMOTIDINE 20 MG PO TABS
ORAL_TABLET | ORAL | Status: AC
  Administered 2022-03-14: 20 mg via ORAL
  Filled 2022-03-14: qty 1

## 2022-03-14 MED ORDER — NALOXONE HCL 0.4 MG/ML IJ SOLN
INTRAMUSCULAR | Status: AC
  Filled 2022-03-14: qty 1

## 2022-03-14 MED ORDER — KETAMINE HCL 50 MG/5ML IJ SOSY
PREFILLED_SYRINGE | INTRAMUSCULAR | Status: AC
  Filled 2022-03-14: qty 5

## 2022-03-14 MED ORDER — DEXAMETHASONE SODIUM PHOSPHATE 10 MG/ML IJ SOLN
INTRAMUSCULAR | Status: AC
  Filled 2022-03-14: qty 1

## 2022-03-14 MED ORDER — BUPIVACAINE HCL (PF) 0.25 % IJ SOLN
INTRAMUSCULAR | Status: AC
  Filled 2022-03-14: qty 30

## 2022-03-14 MED ORDER — LIDOCAINE HCL (PF) 2 % IJ SOLN
INTRAMUSCULAR | Status: AC
  Filled 2022-03-14: qty 5

## 2022-03-14 MED ORDER — INDOCYANINE GREEN 25 MG IV SOLR
1.2500 mg | Freq: Once | INTRAVENOUS | Status: AC
  Administered 2022-03-14: 1.25 mg via INTRAVENOUS
  Filled 2022-03-14: qty 10

## 2022-03-14 MED ORDER — MIDAZOLAM HCL 2 MG/2ML IJ SOLN
INTRAMUSCULAR | Status: DC | PRN
  Administered 2022-03-14: 2 mg via INTRAVENOUS

## 2022-03-14 MED ORDER — HYDROCODONE-ACETAMINOPHEN 5-325 MG PO TABS: 1.0000 | ORAL_TABLET | ORAL | 0 refills | Status: AC | PRN

## 2022-03-14 MED ORDER — LACTATED RINGERS IV SOLN: INTRAVENOUS | Status: DC

## 2022-03-14 MED ORDER — ACETAMINOPHEN 10 MG/ML IV SOLN
INTRAVENOUS | Status: DC | PRN
  Administered 2022-03-14: 1000 mg via INTRAVENOUS

## 2022-03-14 MED ORDER — SUCCINYLCHOLINE CHLORIDE 200 MG/10ML IV SOSY
PREFILLED_SYRINGE | INTRAVENOUS | Status: AC
  Filled 2022-03-14: qty 10

## 2022-03-14 MED ORDER — SUGAMMADEX SODIUM 200 MG/2ML IV SOLN
INTRAVENOUS | Status: DC | PRN
  Administered 2022-03-14: 150 mg via INTRAVENOUS
  Administered 2022-03-14: 100 mg via INTRAVENOUS

## 2022-03-14 MED ORDER — FAMOTIDINE 20 MG PO TABS: 20.0000 mg | ORAL_TABLET | Freq: Once | ORAL | Status: AC

## 2022-03-14 MED ORDER — EPINEPHRINE PF 1 MG/ML IJ SOLN
INTRAMUSCULAR | Status: AC
  Filled 2022-03-14: qty 1

## 2022-03-14 MED ORDER — MIDAZOLAM HCL 2 MG/2ML IJ SOLN
INTRAMUSCULAR | Status: AC
  Filled 2022-03-14: qty 2

## 2022-03-14 MED ORDER — ONDANSETRON HCL 4 MG/2ML IJ SOLN
INTRAMUSCULAR | Status: DC | PRN
  Administered 2022-03-14: 4 mg via INTRAVENOUS

## 2022-03-14 MED ORDER — ONDANSETRON HCL 4 MG/2ML IJ SOLN
INTRAMUSCULAR | Status: AC
  Filled 2022-03-14: qty 2

## 2022-03-14 MED ORDER — ROCURONIUM BROMIDE 10 MG/ML (PF) SYRINGE
PREFILLED_SYRINGE | INTRAVENOUS | Status: AC
  Filled 2022-03-14: qty 10

## 2022-03-14 MED ORDER — CEFAZOLIN SODIUM-DEXTROSE 2-4 GM/100ML-% IV SOLN
INTRAVENOUS | Status: AC
  Filled 2022-03-14: qty 100

## 2022-03-14 MED ORDER — DEXMEDETOMIDINE HCL IN NACL 400 MCG/100ML IV SOLN
INTRAVENOUS | Status: DC | PRN
  Administered 2022-03-14 (×2): 8 ug via INTRAVENOUS
  Administered 2022-03-14 (×2): 12 ug via INTRAVENOUS

## 2022-03-14 MED ORDER — PROPOFOL 10 MG/ML IV BOLUS
INTRAVENOUS | Status: AC
  Filled 2022-03-14: qty 20

## 2022-03-14 MED ORDER — PROPOFOL 10 MG/ML IV BOLUS
INTRAVENOUS | Status: DC | PRN
  Administered 2022-03-14: 200 mg via INTRAVENOUS

## 2022-03-14 MED ORDER — LACTATED RINGERS IV SOLN: INTRAVENOUS | Status: DC | PRN

## 2022-03-14 MED ORDER — 0.9 % SODIUM CHLORIDE (POUR BTL) OPTIME
TOPICAL | Status: DC | PRN
  Administered 2022-03-14: 500 mL

## 2022-03-14 MED ORDER — CHLORHEXIDINE GLUCONATE 0.12 % MT SOLN: 15.0000 mL | Freq: Once | OROMUCOSAL | Status: AC

## 2022-03-14 MED ORDER — CHLORHEXIDINE GLUCONATE 0.12 % MT SOLN
OROMUCOSAL | Status: AC
  Administered 2022-03-14: 15 mL via OROMUCOSAL
  Filled 2022-03-14: qty 15

## 2022-03-14 MED ORDER — KETAMINE HCL 10 MG/ML IJ SOLN
INTRAMUSCULAR | Status: DC | PRN
  Administered 2022-03-14: 50 mg via INTRAVENOUS

## 2022-03-14 MED ORDER — FENTANYL CITRATE (PF) 100 MCG/2ML IJ SOLN
INTRAMUSCULAR | Status: DC | PRN
  Administered 2022-03-14 (×4): 50 ug via INTRAVENOUS

## 2022-03-14 MED ORDER — DEXAMETHASONE SODIUM PHOSPHATE 10 MG/ML IJ SOLN
INTRAMUSCULAR | Status: DC | PRN
  Administered 2022-03-14: 5 mg via INTRAVENOUS

## 2022-03-14 MED ORDER — BUPIVACAINE-EPINEPHRINE 0.25% -1:200000 IJ SOLN
INTRAMUSCULAR | Status: DC | PRN
  Administered 2022-03-14: 30 mL

## 2022-03-14 MED ORDER — ROCURONIUM BROMIDE 100 MG/10ML IV SOLN
INTRAVENOUS | Status: DC | PRN
  Administered 2022-03-14: 20 mg via INTRAVENOUS
  Administered 2022-03-14: 30 mg via INTRAVENOUS

## 2022-03-14 MED ORDER — ACETAMINOPHEN 10 MG/ML IV SOLN
INTRAVENOUS | Status: AC
  Filled 2022-03-14: qty 100

## 2022-03-14 MED ORDER — CEFAZOLIN SODIUM-DEXTROSE 2-4 GM/100ML-% IV SOLN
2.0000 g | INTRAVENOUS | Status: AC
  Administered 2022-03-14: 2 g via INTRAVENOUS

## 2022-03-14 MED ORDER — NALOXONE HCL 0.4 MG/ML IJ SOLN
INTRAMUSCULAR | Status: DC | PRN
  Administered 2022-03-14 (×5): 80 ug via INTRAVENOUS

## 2022-03-14 MED ORDER — EPHEDRINE 5 MG/ML INJ
INTRAVENOUS | Status: AC
  Filled 2022-03-14: qty 5

## 2022-03-14 MED ORDER — ORAL CARE MOUTH RINSE: 15.0000 mL | Freq: Once | OROMUCOSAL | Status: AC

## 2022-03-14 MED ORDER — LIDOCAINE HCL (CARDIAC) PF 100 MG/5ML IV SOSY
PREFILLED_SYRINGE | INTRAVENOUS | Status: DC | PRN
  Administered 2022-03-14: 40 mg via INTRAVENOUS

## 2022-03-14 MED ORDER — PROPOFOL 1000 MG/100ML IV EMUL
INTRAVENOUS | Status: AC
  Filled 2022-03-14: qty 100

## 2022-03-14 MED ORDER — HYDROMORPHONE HCL 1 MG/ML IJ SOLN: 0.2500 mg | INTRAMUSCULAR | Status: DC | PRN

## 2022-03-14 SURGICAL SUPPLY — 55 items
ADH SKN CLS APL DERMABOND .7 (GAUZE/BANDAGES/DRESSINGS) ×1
BAG PRESSURE INF REUSE 1000 (BAG) IMPLANT
BLADE SURG SZ11 CARB STEEL (BLADE) ×1 IMPLANT
CANNULA REDUC XI 12-8 STAPL (CANNULA) ×1
CANNULA REDUCER 12-8 DVNC XI (CANNULA) ×1 IMPLANT
CATH REDDICK CHOLANGI 4FR 50CM (CATHETERS) IMPLANT
CLIP LIGATING HEM O LOK PURPLE (MISCELLANEOUS) IMPLANT
CLIP LIGATING HEMO O LOK GREEN (MISCELLANEOUS) ×1 IMPLANT
DERMABOND ADVANCED .7 DNX12 (GAUZE/BANDAGES/DRESSINGS) ×1 IMPLANT
DRAPE ARM DVNC X/XI (DISPOSABLE) ×4 IMPLANT
DRAPE C-ARM XRAY 36X54 (DRAPES) IMPLANT
DRAPE COLUMN DVNC XI (DISPOSABLE) ×1 IMPLANT
DRAPE DA VINCI XI ARM (DISPOSABLE) ×4
DRAPE DA VINCI XI COLUMN (DISPOSABLE) ×1
ELECT REM PT RETURN 9FT ADLT (ELECTROSURGICAL) ×1
ELECTRODE REM PT RTRN 9FT ADLT (ELECTROSURGICAL) ×1 IMPLANT
GLOVE BIO SURGEON STRL SZ 6.5 (GLOVE) ×2 IMPLANT
GLOVE BIOGEL PI IND STRL 6.5 (GLOVE) ×2 IMPLANT
GOWN STRL REUS W/ TWL LRG LVL3 (GOWN DISPOSABLE) ×3 IMPLANT
GOWN STRL REUS W/TWL LRG LVL3 (GOWN DISPOSABLE) ×3
GRASPER SUT TROCAR 14GX15 (MISCELLANEOUS) ×1 IMPLANT
IRRIGATOR SUCT 8 DISP DVNC XI (IRRIGATION / IRRIGATOR) IMPLANT
IRRIGATOR SUCTION 8MM XI DISP (IRRIGATION / IRRIGATOR)
IV CATH ANGIO 12GX3 LT BLUE (NEEDLE) IMPLANT
IV NS 1000ML (IV SOLUTION)
IV NS 1000ML BAXH (IV SOLUTION) IMPLANT
KIT PINK PAD W/HEAD ARE REST (MISCELLANEOUS) ×1
KIT PINK PAD W/HEAD ARM REST (MISCELLANEOUS) ×1 IMPLANT
LABEL OR SOLS (LABEL) ×1 IMPLANT
MANIFOLD NEPTUNE II (INSTRUMENTS) ×1 IMPLANT
NDL INSUFFLATION 14GA 120MM (NEEDLE) ×1 IMPLANT
NEEDLE HYPO 22GX1.5 SAFETY (NEEDLE) ×1 IMPLANT
NEEDLE INSUFFLATION 14GA 120MM (NEEDLE) ×1 IMPLANT
NS IRRIG 500ML POUR BTL (IV SOLUTION) ×1 IMPLANT
OBTURATOR OPTICAL STANDARD 8MM (TROCAR) ×1
OBTURATOR OPTICAL STND 8 DVNC (TROCAR) ×1
OBTURATOR OPTICALSTD 8 DVNC (TROCAR) ×1 IMPLANT
PACK LAP CHOLECYSTECTOMY (MISCELLANEOUS) ×1 IMPLANT
SEAL CANN UNIV 5-8 DVNC XI (MISCELLANEOUS) ×3 IMPLANT
SEAL XI 5MM-8MM UNIVERSAL (MISCELLANEOUS) ×3
SET TUBE SMOKE EVAC HIGH FLOW (TUBING) ×1 IMPLANT
SOL ELECTROSURG ANTI STICK (MISCELLANEOUS) ×1
SOLUTION ELECTROSURG ANTI STCK (MISCELLANEOUS) ×1 IMPLANT
SPIKE FLUID TRANSFER (MISCELLANEOUS) ×2 IMPLANT
SPONGE T-LAP 4X18 ~~LOC~~+RFID (SPONGE) IMPLANT
STAPLER CANNULA SEAL DVNC XI (STAPLE) ×1 IMPLANT
STAPLER CANNULA SEAL XI (STAPLE)
SUT MNCRL 4-0 (SUTURE) ×1
SUT MNCRL 4-0 27XMFL (SUTURE) ×1
SUT VICRYL 0 UR6 27IN ABS (SUTURE) ×1 IMPLANT
SUTURE MNCRL 4-0 27XMF (SUTURE) ×1 IMPLANT
SYS BAG RETRIEVAL 10MM (BASKET) ×1
SYSTEM BAG RETRIEVAL 10MM (BASKET) ×1 IMPLANT
TRAP FLUID SMOKE EVACUATOR (MISCELLANEOUS) ×1 IMPLANT
WATER STERILE IRR 500ML POUR (IV SOLUTION) ×1 IMPLANT

## 2022-03-14 NOTE — Discharge Instructions (Addendum)
  Diet: Resume home heart healthy regular diet.   Activity: No heavy lifting >20 pounds (children, pets, laundry, garbage) or strenuous activity until follow-up, but light activity and walking are encouraged. Do not drive or drink alcohol if taking narcotic pain medications.  Wound care: Remove dressing tomorrow. Once dressing removed, may shower with soapy water and pat dry (do not rub incisions), but no baths or submerging incision underwater until follow-up. (no swimming)   Medications: Resume all home medications. For mild to moderate pain: acetaminophen (Tylenol) or ibuprofen (if no kidney disease). Combining Tylenol with alcohol can substantially increase your risk of causing liver disease. Narcotic pain medications, if prescribed, can be used for severe pain, though may cause nausea, constipation, and drowsiness. Do not combine Tylenol and Norco within a 6 hour period as Norco contains Tylenol. If you do not need the narcotic pain medication, you do not need to fill the prescription.  Call office [phone]) at any time if any questions, worsening pain, fevers/chills, bleeding, drainage from incision site, or other concerns.   AMBULATORY SURGERY  DISCHARGE INSTRUCTIONS   The drugs that you were given will stay in your system until tomorrow so for the next 24 hours you should not:  Drive an automobile Make any legal decisions Drink any alcoholic beverage   You may resume regular meals tomorrow.  Today it is better to start with liquids and gradually work up to solid foods.  You may eat anything you prefer, but it is better to start with liquids, then soup and crackers, and gradually work up to solid foods.   Please notify your doctor immediately if you have any unusual bleeding, trouble breathing, redness and pain at the surgery site, drainage, fever, or pain not relieved by medication.    Please contact your physician with any problems or Same Day Surgery at [phone],  Monday through Friday 6 am to 4 pm, or [place] at [facility] number at [phone].

## 2022-03-14 NOTE — Anesthesia Postprocedure Evaluation (Signed)
Anesthesia Post Note  Patient: AERYK MCIRVIN  Procedure(s) Performed: XI ROBOTIC ASSISTED LAPAROSCOPIC CHOLECYSTECTOMY (Abdomen) INDOCYANINE GREEN FLUORESCENCE IMAGING (ICG) (Abdomen)  Patient location during evaluation: PACU Anesthesia Type: General Level of consciousness: awake and alert Pain management: pain level controlled Vital Signs Assessment: post-procedure vital signs reviewed and stable Respiratory status: spontaneous breathing, nonlabored ventilation, respiratory function stable and patient connected to nasal cannula oxygen Cardiovascular status: blood pressure returned to baseline and stable Postop Assessment: no apparent nausea or vomiting Anesthetic complications: no   No notable events documented.   Last Vitals:  Vitals:   03/14/22 1117 03/14/22 1130  BP: (!) 141/94 138/88  Pulse: 88 88  Resp: 20 15  Temp: (!) 36.1 C   SpO2: 98% 100%    Last Pain:  Vitals:   03/14/22 1117  TempSrc:   PainSc: Asleep                 Ilene Qua

## 2022-03-14 NOTE — Interval H&P Note (Signed)
History and Physical Interval Note:  03/14/2022 9:17 AM  Christopher Castaneda  has presented today for surgery, with the diagnosis of K80.20 cholelithiasis w/o cholecystitis.  The various methods of treatment have been discussed with the patient and family. After consideration of risks, benefits and other options for treatment, the patient has consented to  Procedure(s): XI ROBOTIC ASSISTED LAPAROSCOPIC CHOLECYSTECTOMY (N/A) [place] (ICG) (N/A) as a surgical intervention.  The patient's history has been reviewed, patient examined, no change in status, stable for surgery.  I have reviewed the patient's chart and labs.  Questions were answered to the patient's satisfaction.     Herbert Pun

## 2022-03-14 NOTE — Transfer of Care (Signed)
Immediate Anesthesia Transfer of Care Note  Patient: Christopher Castaneda  Procedure(s) Performed: XI ROBOTIC ASSISTED LAPAROSCOPIC CHOLECYSTECTOMY (Abdomen) INDOCYANINE GREEN FLUORESCENCE IMAGING (ICG) (Abdomen)  Patient Location: PACU  Anesthesia Type:General  Level of Consciousness: drowsy  Airway & Oxygen Therapy: Patient Spontanous Breathing and Patient connected to face mask oxygen  Post-op Assessment: Report given to RN and Post -op Vital signs reviewed and stable  Post vital signs: Reviewed and stable  Last Vitals:  Vitals Value Taken Time  BP 141/94 03/14/22 1117  Temp 97.70F   Pulse 87 03/14/22 1119  Resp 16 03/14/22 1119  SpO2 99 % 03/14/22 1119  Vitals shown include unvalidated device data.  Last Pain:  Vitals:   03/14/22 0800  TempSrc: Tympanic  PainSc: 0-No pain         Complications: No notable events documented.

## 2022-03-14 NOTE — Addendum Note (Signed)
Addendum  created 03/14/22 1202 by Hilbert Odor, CRNA   Intraprocedure Meds edited

## 2022-03-14 NOTE — Anesthesia Preprocedure Evaluation (Signed)
Anesthesia Evaluation  Patient identified by MRN, date of birth, ID band Patient awake    Reviewed: Allergy & Precautions, NPO status , Patient's Chart, lab work & pertinent test results  History of Anesthesia Complications Negative for: history of anesthetic complications  Airway Mallampati: III  TM Distance: >3 FB Neck ROM: full    Dental no notable dental hx.    Pulmonary sleep apnea and Continuous Positive Airway Pressure Ventilation    Pulmonary exam normal        Cardiovascular negative cardio ROS Normal cardiovascular exam     Neuro/Psych negative neurological ROS  negative psych ROS   GI/Hepatic negative GI ROS, Neg liver ROS,GERD  ,,  Endo/Other  negative endocrine ROS    Renal/GU      Musculoskeletal  (+) Arthritis ,    Abdominal   Peds  Hematology negative hematology ROS (+)   Anesthesia Other Findings Past Medical History: No date: Arthritis     Comment:  right hand No date: GERD (gastroesophageal reflux disease) No date: Gout No date: History of kidney stones No date: Kidney stones No date: Obesity No date: Pneumonia No date: PONV (postoperative nausea and vomiting)     Comment:  after lithotripsy No date: Sleep apnea     Comment:  CPAP, has not used for several yrs. Reports caused by               reflux No date: Vocal cord dysfunction  Past Surgical History: 12/02/2016: CHONDROPLASTY; Right     Comment:  Procedure: CHONDROPLASTY;  Surgeon: Leim Fabry, MD;                Location: [facility];  Service: Orthopedics;                Laterality: Right; No date: COLONOSCOPY 01/04/2020: COLONOSCOPY; N/A     Comment:  Procedure: COLONOSCOPY;  Surgeon: Toledo, Benay Pike, MD;              Location: [facility];  Service: Gastroenterology;                Laterality: N/A; No date: COLONOSCOPY WITH ESOPHAGOGASTRODUODENOSCOPY (EGD) 01/04/2020: ESOPHAGOGASTRODUODENOSCOPY; N/A      Comment:  Procedure: ESOPHAGOGASTRODUODENOSCOPY (EGD);  Surgeon:               Toledo, Benay Pike, MD;  Location: [facility];                Service: Gastroenterology;  Laterality: N/A; 06/27/2021: EXTRACORPOREAL SHOCK WAVE LITHOTRIPSY; Right     Comment:  Procedure: EXTRACORPOREAL SHOCK WAVE LITHOTRIPSY (ESWL);              Surgeon: Abbie Sons, MD;  Location: [facility];                Service: Urology;  Laterality: Right; No date: HERNIA REPAIR 12/02/2016: KNEE ARTHROSCOPY WITH MEDIAL MENISECTOMY; Right     Comment:  Procedure: KNEE ARTHROSCOPY WITH PARTIAL MEDIAL               MENISECTOMY;  Surgeon: Leim Fabry, MD;  Location:               [facility];  Service: Orthopedics;  Laterality:               Right; No date: Lithrotripsy for kidney stone removal 12/02/2016: SYNOVECTOMY; Right     Comment:  Procedure: SYNOVECTOMY;  Surgeon: Leim Fabry, MD;  Location: [facility];  Service: Orthopedics;                Laterality: Right;  BMI    Body Mass Index: 31.95 kg/m      Reproductive/Obstetrics negative OB ROS                             Anesthesia Physical Anesthesia Plan  ASA: 2  Anesthesia Plan: General ETT   Post-op Pain Management: Ofirmev IV (intra-op)*, Toradol IV (intra-op)* and Dilaudid IV   Induction: Intravenous  PONV Risk Score and Plan: 2 and Ondansetron, Dexamethasone, Midazolam and Treatment may vary due to age or medical condition  Airway Management Planned: Oral ETT  Additional Equipment:   Intra-op Plan:   Post-operative Plan: Extubation in OR  Informed Consent: I have reviewed the patients History and Physical, chart, labs and discussed the procedure including the risks, benefits and alternatives for the proposed anesthesia with the patient or authorized representative who has indicated his/her understanding and acceptance.     Dental Advisory Given  Plan Discussed with:  Anesthesiologist, CRNA and Surgeon  Anesthesia Plan Comments: (Patient consented for risks of anesthesia including but not limited to:  - adverse reactions to medications - damage to eyes, teeth, lips or other oral mucosa - nerve damage due to positioning  - sore throat or hoarseness - Damage to heart, brain, nerves, lungs, other parts of body or loss of life  Patient voiced understanding.)        Anesthesia Quick Evaluation

## 2022-03-14 NOTE — Op Note (Signed)
Preoperative diagnosis: Cholelithiasis  Postoperative diagnosis: Same  Procedure: Robotic Assisted Laparoscopic Cholecystectomy.   Anesthesia: GETA   Surgeon: Dr. Windell Moment  Wound Classification: Clean Contaminated  Indications: Patient is a 57 y.o. male developed right upper quadrant pain and on workup was found to have cholelithiasis with a normal common duct. Robotic Assisted Laparoscopic cholecystectomy was elected.  Findings:  Critical view of safety achieved Cystic duct and artery identified, ligated and divided Adequate hemostasis   Description of procedure: The patient was placed on the operating table in the supine position. General anesthesia was induced. A time-out was completed verifying correct patient, procedure, site, positioning, and implant(s) and/or special equipment prior to beginning this procedure. An orogastric tube was placed. The abdomen was prepped and draped in the usual sterile fashion.  An incision was made in a natural skin line below the umbilicus.  The fascia was elevated and the Veress needle inserted. Proper position was confirmed by aspiration and saline meniscus test.  The abdomen was insufflated with carbon dioxide to a pressure of 15 mmHg. The patient tolerated insufflation well. A 8-mm trocar was then inserted in optiview fashion.  The laparoscope was inserted and the abdomen inspected. No injuries from initial trocar placement were noted. Additional trocars were then inserted in the following locations: an 8-mm trocar in the left lateral abdomen, and another two 8-mm trocars to the right side of the abdomen 5 cm appart. The umbilical trocar was changed to a 12 mm trocar all under direct visualization. The abdomen was inspected and no abnormalities were found. The table was placed in the reverse Trendelenburg position with the right side up. The robotic arms were docked and target anatomy identified. Instrument inserted under direct visualization.   Filmy adhesions between the gallbladder and omentum, duodenum and transverse colon were lysed with electrocautery. The dome of the gallbladder was grasped with a prograsp and retracted over the dome of the liver. The infundibulum was also grasped with an atraumatic grasper and retracted toward the right lower quadrant. This maneuver exposed Calot's triangle. The peritoneum overlying the gallbladder infundibulum was then incised and the cystic duct and cystic artery identified and circumferentially dissected. Critical view of safety reviewed before ligating any structure. Firefly images taken to visualize biliary ducts. The cystic duct and cystic artery were then doubly clipped and divided close to the gallbladder.  The gallbladder was then dissected from its peritoneal attachments by electrocautery. Hemostasis was checked and the gallbladder and contained stones were removed using an endoscopic retrieval bag. The gallbladder was passed off the table as a specimen. There was no evidence of bleeding from the gallbladder fossa or cystic artery or leakage of the bile from the cystic duct stump. Secondary trocars were removed under direct vision. No bleeding was noted. The robotic arms were undoked. The scope was withdrawn and the umbilical trocar removed. The abdomen was allowed to collapse. The fascia of the 65m trocar sites was closed with figure-of-eight 0 vicryl sutures. The skin was closed with subcuticular sutures of 4-0 monocryl and topical skin adhesive. The orogastric tube was removed.  The patient tolerated the procedure well and was taken to the postanesthesia care unit in stable condition.   Specimen: Gallbladder  Complications: None  EBL: 5 mL

## 2022-03-14 NOTE — Anesthesia Procedure Notes (Signed)
Procedure Name: Intubation Date/Time: 03/14/2022 9:46 AM  Performed by: Hilbert Odor, CRNAPre-anesthesia Checklist: Patient identified, Patient being monitored, Timeout performed, Emergency Drugs available and Suction available Patient Re-evaluated:Patient Re-evaluated prior to induction Oxygen Delivery Method: Circle system utilized Preoxygenation: Pre-oxygenation with 100% oxygen Induction Type: IV induction Ventilation: Mask ventilation without difficulty Laryngoscope Size: Mac and 3 Grade View: Grade I Tube type: Oral Tube size: 7.0 mm Number of attempts: 1 Airway Equipment and Method: Stylet Placement Confirmation: ETT inserted through vocal cords under direct vision, positive ETCO2 and breath sounds checked- equal and bilateral Secured at: 22 cm Tube secured with: Tape Dental Injury: Teeth and Oropharynx as per pre-operative assessment

## 2022-03-17 LAB — SURGICAL PATHOLOGY

## 2022-06-12 ENCOUNTER — Other Ambulatory Visit: Payer: Self-pay | Admitting: Family Medicine

## 2022-06-12 DIAGNOSIS — K7581 Nonalcoholic steatohepatitis (NASH): Secondary | ICD-10-CM

## 2022-11-25 ENCOUNTER — Ambulatory Visit
Admission: RE | Admit: 2022-11-25 | Discharge: 2022-11-25 | Disposition: A | Discharge status: Home / Self Care | Payer: 59 | Source: Ambulatory Visit | Attending: Family Medicine | Admitting: Family Medicine

## 2022-11-25 DIAGNOSIS — K7581 Nonalcoholic steatohepatitis (NASH): Secondary | ICD-10-CM | POA: Insufficient documentation

## 2022-12-11 IMAGING — CR DG ABDOMEN 1V
1 series · 1 of 1 positions shown · non-contrast
Comparison: Abdominal CT yesterday.

CLINICAL DATA: Right ureteral calculus.  Follow-up kidney stone.

EXAM:
ABDOMEN - 1 VIEW

[abdomen kub]
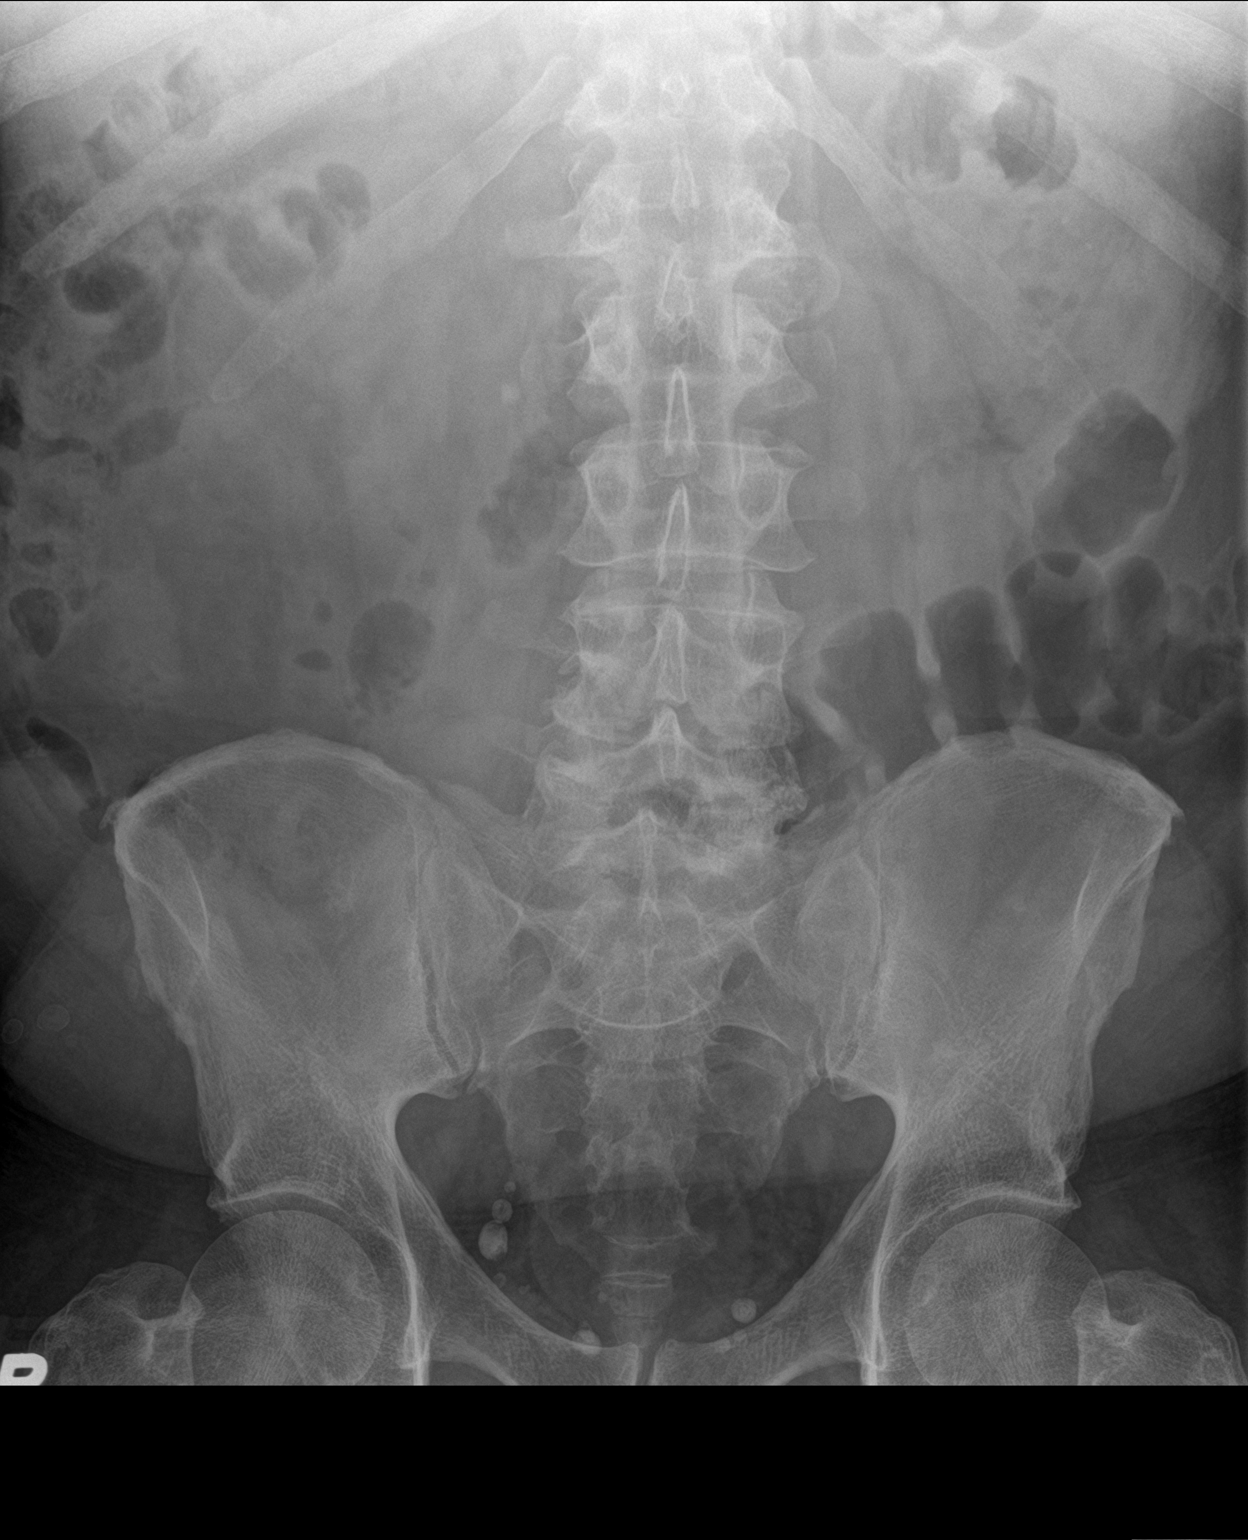

[1 of 1 positions shown; findings below may reference images not displayed]

FINDINGS: 6 mm calcification at the level of L2 on the right corresponds to
proximal ureteral stone on CT. This is not significantly changed in
position from prior exam. The additional nonobstructing intrarenal
calculi are not well seen on the current exam, partially obscured by
overlying stool and bowel gas. There are multiple pelvic
phleboliths. Normal bowel gas pattern.
IMPRESSION: A 6 mm calcification at the level of L2 on the right corresponds to
proximal ureteral stone on CT, unchanged in position. The additional
nonobstructing intrarenal calculi are not well seen on the current
exam.

## 2022-12-19 IMAGING — CR DG ABDOMEN 1V
1 series · 2 of 2 positions shown · non-contrast
Comparison: KUB, 06/19/2021.  CT AP, 06/18/2021.

CLINICAL DATA: RIGHT nephrolithiasis.

EXAM:
ABDOMEN - 1 VIEW

[Series 1: dg abd 1 view · 0.14mm/px · 2 of 2 slices shown]
[im 1/2]
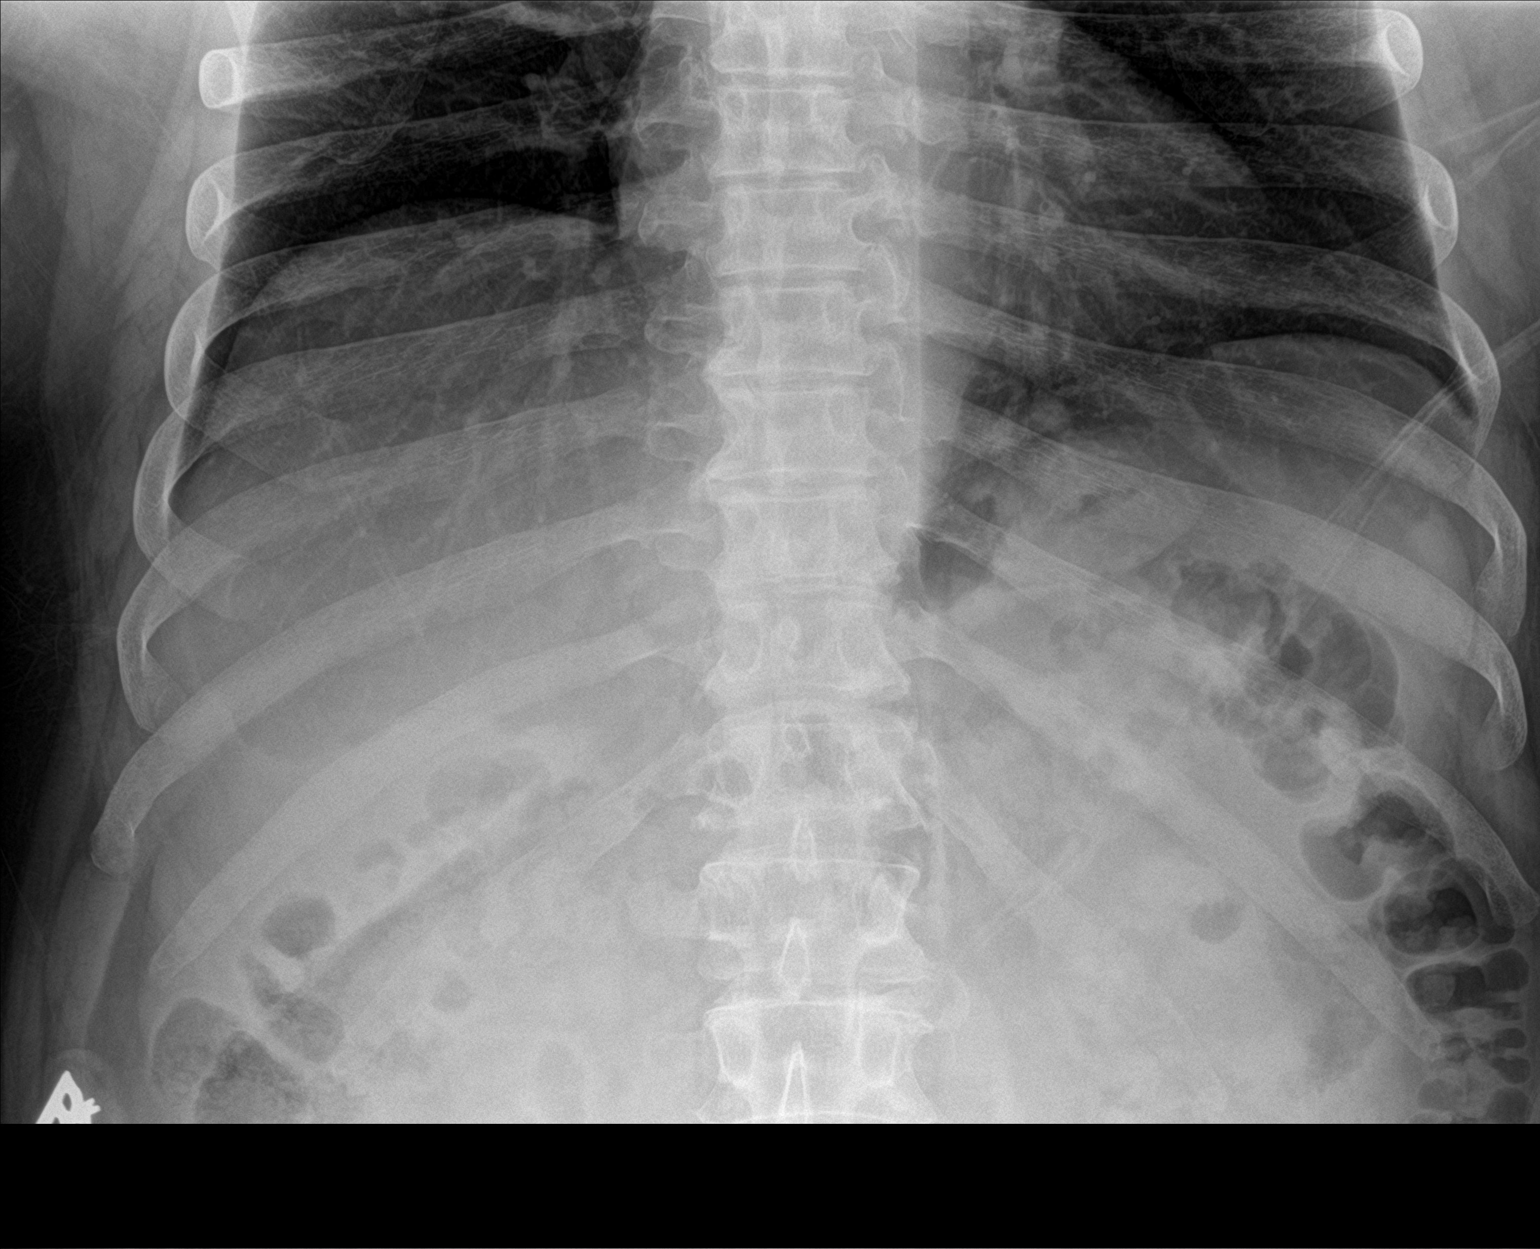
[im 2/2]
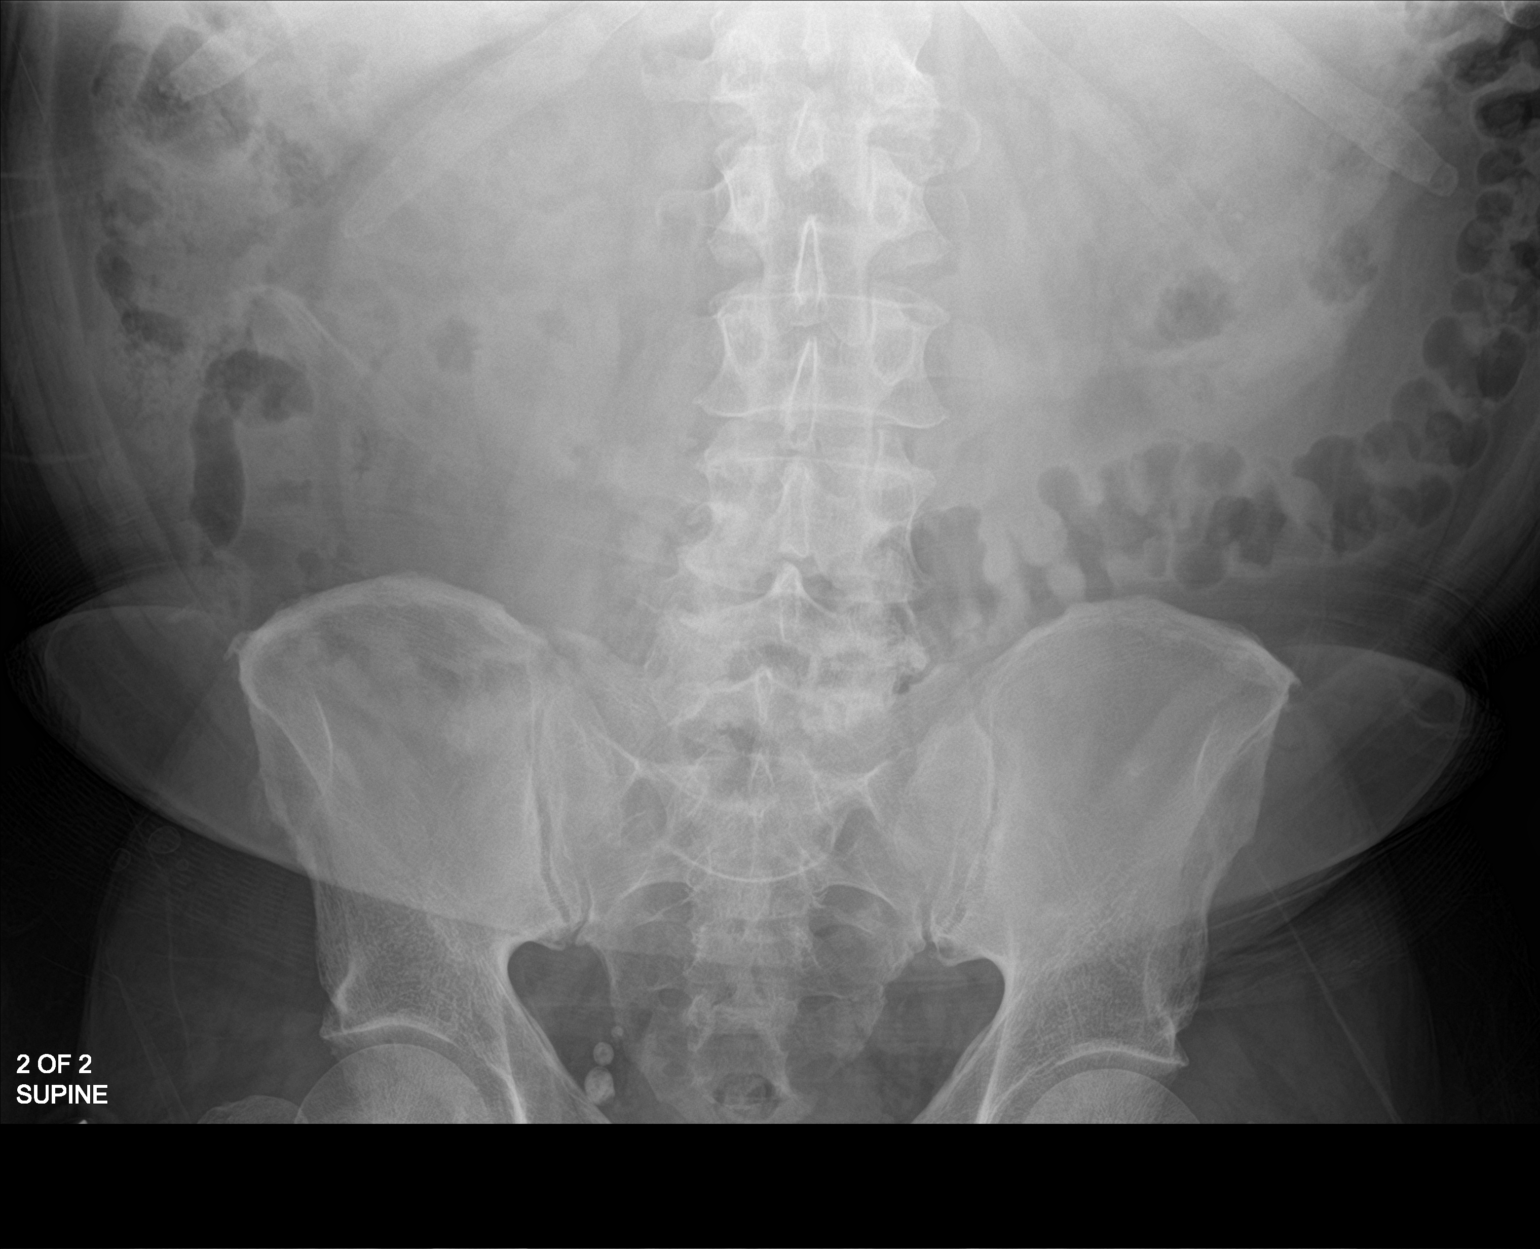

[2 of 2 positions shown; findings below may reference images not displayed]

FINDINGS: The bowel gas pattern is normal. Previously imaged RIGHT proximal
ureteral nephrolith is not appreciated on this evaluation. No
radio-opaque calculi or other significant radiographic abnormality
are seen.
IMPRESSION: Previously imaged RIGHT proximal ureteral nephrolith is not
appreciated on this evaluation, and may have since passed.
